# Patient Record
Sex: Male | Born: 1955 | Race: White | Hispanic: No | Smoking: Former smoker
Health system: Southern US, Community
[De-identification: ages and names within clinical notes are randomized; demographics above are authoritative.]

## PROBLEM LIST (undated history)

## (undated) DIAGNOSIS — M1990 Unspecified osteoarthritis, unspecified site: Secondary | ICD-10-CM

## (undated) DIAGNOSIS — F419 Anxiety disorder, unspecified: Secondary | ICD-10-CM

## (undated) DIAGNOSIS — M4802 Spinal stenosis, cervical region: Secondary | ICD-10-CM

## (undated) DIAGNOSIS — F329 Major depressive disorder, single episode, unspecified: Secondary | ICD-10-CM

## (undated) DIAGNOSIS — F209 Schizophrenia, unspecified: Secondary | ICD-10-CM

## (undated) DIAGNOSIS — R413 Other amnesia: Secondary | ICD-10-CM

## (undated) DIAGNOSIS — F32A Depression, unspecified: Secondary | ICD-10-CM

## (undated) DIAGNOSIS — I10 Essential (primary) hypertension: Secondary | ICD-10-CM

## (undated) DIAGNOSIS — K220 Achalasia of cardia: Secondary | ICD-10-CM

## (undated) DIAGNOSIS — K219 Gastro-esophageal reflux disease without esophagitis: Secondary | ICD-10-CM

## (undated) DIAGNOSIS — F0391 Unspecified dementia with behavioral disturbance: Secondary | ICD-10-CM

## (undated) DIAGNOSIS — F03918 Unspecified dementia, unspecified severity, with other behavioral disturbance: Secondary | ICD-10-CM

## (undated) HISTORY — PX: DIAGNOSTIC LAPAROSCOPY: SUR761

## (undated) HISTORY — PX: APPENDECTOMY: SHX54

## (undated) HISTORY — PX: HERNIA REPAIR: SHX51

## (undated) HISTORY — DX: Essential (primary) hypertension: I10

---

## 2005-03-06 ENCOUNTER — Ambulatory Visit: Payer: Self-pay | Admitting: Cardiology

## 2005-12-27 ENCOUNTER — Ambulatory Visit: Payer: Self-pay | Admitting: Internal Medicine

## 2006-01-11 ENCOUNTER — Ambulatory Visit: Payer: Self-pay | Admitting: Internal Medicine

## 2006-01-11 ENCOUNTER — Ambulatory Visit (HOSPITAL_COMMUNITY): Admission: RE | Admit: 2006-01-11 | Discharge: 2006-01-11 | Payer: Self-pay | Admitting: Internal Medicine

## 2006-02-19 ENCOUNTER — Ambulatory Visit: Payer: Self-pay | Admitting: Internal Medicine

## 2008-04-07 ENCOUNTER — Ambulatory Visit: Payer: Self-pay | Admitting: Cardiology

## 2008-04-13 ENCOUNTER — Ambulatory Visit: Payer: Self-pay | Admitting: Cardiology

## 2008-05-08 ENCOUNTER — Ambulatory Visit: Payer: Self-pay | Admitting: Cardiology

## 2009-06-04 ENCOUNTER — Ambulatory Visit: Payer: Self-pay | Admitting: Cardiology

## 2011-03-28 ENCOUNTER — Ambulatory Visit (INDEPENDENT_AMBULATORY_CARE_PROVIDER_SITE_OTHER): Payer: BC Managed Care – PPO | Admitting: Internal Medicine

## 2011-03-28 DIAGNOSIS — R1011 Right upper quadrant pain: Secondary | ICD-10-CM

## 2011-03-28 DIAGNOSIS — R1012 Left upper quadrant pain: Secondary | ICD-10-CM

## 2011-03-28 DIAGNOSIS — R112 Nausea with vomiting, unspecified: Secondary | ICD-10-CM

## 2011-03-31 ENCOUNTER — Encounter (HOSPITAL_BASED_OUTPATIENT_CLINIC_OR_DEPARTMENT_OTHER): Payer: BC Managed Care – PPO | Admitting: Internal Medicine

## 2011-03-31 ENCOUNTER — Ambulatory Visit (HOSPITAL_COMMUNITY)
Admission: RE | Admit: 2011-03-31 | Discharge: 2011-03-31 | Disposition: A | Discharge status: Home / Self Care | Payer: BC Managed Care – PPO | Source: Ambulatory Visit | Attending: Internal Medicine | Admitting: Internal Medicine

## 2011-03-31 DIAGNOSIS — R142 Eructation: Secondary | ICD-10-CM

## 2011-03-31 DIAGNOSIS — R112 Nausea with vomiting, unspecified: Secondary | ICD-10-CM

## 2011-03-31 DIAGNOSIS — B3781 Candidal esophagitis: Secondary | ICD-10-CM

## 2011-03-31 DIAGNOSIS — R109 Unspecified abdominal pain: Secondary | ICD-10-CM | POA: Insufficient documentation

## 2011-03-31 DIAGNOSIS — K220 Achalasia of cardia: Secondary | ICD-10-CM | POA: Insufficient documentation

## 2011-03-31 DIAGNOSIS — E119 Type 2 diabetes mellitus without complications: Secondary | ICD-10-CM | POA: Insufficient documentation

## 2011-03-31 DIAGNOSIS — R141 Gas pain: Secondary | ICD-10-CM

## 2011-03-31 LAB — GLUCOSE, CAPILLARY: Glucose-Capillary: 173 mg/dL — ABNORMAL HIGH (ref 70–99)

## 2011-04-11 NOTE — Assessment & Plan Note (Signed)
[facility]                          [facility]   NAME:CHILTONAndrius, Derek Hunter                       MRN:          [id number]  DATE:04/07/2008                            DOB:          26-Jun-1956    HISTORY:  Derek Hunter has some chest discomfort and shortness of breath.  He is referred by Dr. Fara Chute for further evaluation.  Derek Hunter  can feel this chest discomfort or shortness of breath at any time.  He  has not had any syncope or presyncope.  He is aware that he has an  anxiety problem and that this can play a role in his symptomatology.  He  had a stress echo done in April 2006.  At that time, there were  excellent pictures and he had good wall motion.  He did have some  prolonging of his increased heart rate after stress and he had 3 beats  of supraventricular tachycardia in the past.   ALLERGIES:  NO KNOWN DRUG ALLERGIES.   MEDICATIONS:  1. Protonix.  2. Citalopram.   OTHER MEDICAL PROBLEMS:  See the list below.   SOCIAL HISTORY:  The patient is married and works at [employer].   FAMILY HISTORY:  There is a family history of coronary disease.   REVIEW OF SYSTEMS:  Overall, he is feeling fairly well today.  He does  have some increased gas.  He knows that he feels anxious about things in  general.  Otherwise, he also has some mild dizziness.  Otherwise, his  review of systems is negative.   PHYSICAL EXAMINATION:  VITAL SIGNS:  Blood pressure is 142/80 with a  pulse of 79.  Weight is 195 pounds.  GENERAL:  The patient is oriented  to person, time and place.  Affect reveals that at baseline he appeared  to be mildly anxious.  He is here with his wife today.  HEENT:  Reveals no xanthelasma.  He has normal extraocular motion.  NECK:  There are no carotid bruits.  There is no jugular venous  distention.  LUNGS:  Clear.  Respiratory effort is not labored.  CARDIAC:  Reveals an S1-S2.  There are no clicks or  significant murmurs.  ABDOMEN:  Soft.  He has normal bowel sounds.  EXTREMITIES:  There is no peripheral edema.  There are 2+ distal pulses.   DIAGNOSTICS:  EKG is normal.   PROBLEMS:  1. History of appendectomy and hernia surgery.  2. History of gastroesophageal reflux disease.  3. History of some question of elevated cholesterol which is followed      by Dr. Neita Carp.  4. Also question of increased glucose.  This issue is followed by Dr.      Neita Carp.  5. Anxiety.  This definitely plays a role with his overall status.  6. Chest pain and shortness of breath.  At this point today, I do not      find any definite evidence of coronary disease.  However, he does      have a family history of coronary disease.  I feel that  we should      proceed with a follow up stress echo to reassess his overall      status.  I will then see him in followup to continue to help      reassure him if his study shows no abnormalities.     Derek Abed, MD, [facility]  Electronically Signed    JDK/MedQ  DD: 04/07/2008  DT: 04/07/2008  Job #: [id number]   cc:   Fara Chute

## 2011-04-11 NOTE — Assessment & Plan Note (Signed)
[facility]                          [facility]   NAME:Derek Hunter, Derek Hunter                       MRN:          [id number]  DATE:05/08/2008                            DOB:          08/25/1956    Mr. Dombeck is seen for followup.  I saw him on 04/07/2008.  See my  consult note.  At that time, he had been having some chest pain and  shortness of breath.  There were some risk factors.  We decided to  proceed with a stress echo.  The stress echo was completely normal.  The  patient returned today and we talked further.  He is feeling well.  He  has not had any marked symptoms since the last visit.   PAST MEDICAL HISTORY:   ALLERGIES:  No known drug allergies.   MEDICATIONS:  Protonix and citalopram.  He feels that the citalopram is  helping him.   OTHER MEDICAL PROBLEMS:  See the list on my note of 04/07/2008.   REVIEW OF SYSTEMS:  He is stable today and his review of systems is  negative.   PHYSICAL EXAMINATION:  Blood pressure is 130/76 with a pulse of 75.  The  patient is oriented to person, time, and place.  Cardiac exam reveals an  S1 with an S2, but no clicks or significant murmurs.   Problems include those listed on my note of 04/07/2008.  #5.  Anxiety.  This definitely plays a role.  Citalopram is helping.  #6.  Chest pain and shortness of breath.  His stress echo was normal.  I  would suggest primary prevention measures and no further cardiac workup  at this time.     Luis Abed, MD, [facility]  Electronically Signed    JDK/MedQ  DD: 05/08/2008  DT: 05/09/2008  Job #: [id number]   cc:   Fara Chute

## 2011-04-14 NOTE — Op Note (Signed)
NAME:  Derek Hunter, Derek Hunter                ACCOUNT NO.:  [id number]   MEDICAL RECORD NO.:  [id number]          PATIENT TYPE:  AMB   LOCATION:  DAY                           FACILITY:  [facility]   PHYSICIAN:  R. Roetta Sessions, M.D. DATE OF BIRTH:  07-01-1956   DATE OF PROCEDURE:  01/11/2006  DATE OF DISCHARGE:                                 OPERATIVE REPORT   PROCEDURE:  Esophagogastroduodenoscopy with Elease Hashimoto dilation followed by  diagnostic colonoscopy and ileoscopy.   INDICATIONS FOR PROCEDURE:  The patient is a 55 year old gentleman with  abdominal swelling and atypical chest pain. Reflux symptoms felt to be  well controlled on once daily Protonix 40 mg. He has never had his lower GI  tract imaged. Colonoscopy and EGD are now being done. This approach has been  discussed with the patient at length. Potential risks, benefits, and  alternatives have been reviewed. The potential for esophageal dilation has  also been reviewed with the potential risk, benefits and alternatives. All  questions were answered. Please see the documentation in the medical record.   PROCEDURE NOTE:  O2 saturation, blood pressure, pulse, and respirations were  monitored throughout the entire procedure. Conscious sedation with Versed 5  mg IV and Demerol 100 mg IV in divided doses.   INSTRUMENT:  Olympus video chip system.   FINDINGS:  Esophagogastroduodenoscopy:  Examination of the tubular esophagus  revealed questionable cervical web. Esophageal mucosa otherwise appeared  normal. EG junction easily traversed.   Stomach:  Gastric cavity was empty and insufflated well with air. Thorough  examination of gastric mucosa including retroflexed view of the proximal  stomach and esophagogastric junction demonstrated no abnormalities aside  from hiatal hernia. Pylorus patent and easily traversed. Examination of bulb  and second portion revealed no abnormalities.   THERAPEUTIC/DIAGNOSTIC MANEUVERS:  A 56-French Maloney  dilator was passed to  full insertion with ease. A look back revealed no apparent complication  related to passage of the dilator. There is a small rent on the mucosa at  the EG junction.   The patient tolerated the procedure well and was prepared for colonoscopy.  Digital rectal examination revealed no abnormalities.   ENDOSCOPIC FINDINGS:  Prep was good.   Rectum:  Examination of the rectal mucosa including retroflexed view of the  anal verge revealed anal verge revealed no abnormalities.   Colon:  Colonic mucosa was surveyed from the rectosigmoid junction through  the left, transverse, and right colon to the area of the appendiceal  orifice, ileocecal valve, and cecum. These structures were well seen and  photographed for the record. The terminal ileum was intubated to 10 cm. From  this level, the scope was slowly and cautiously withdrawn, and all  previously mentioned mucosal surfaces were again seen. The colonic mucosa  appeared normal. The patient tolerated the procedure well and was reactive  to endoscopy. Cecal withdraw time 10 minutes.   IMPRESSION:  Colonoscopy:  Normal rectum, colon, terminal ileum.   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  Otherwise normal appearing esophageal mucosa status post dilation as  described above. Small hiatal hernia.  Otherwise normal stomach, D1 and D2.   RECOMMENDATIONS:  1.  Increase her Protonix to 40 mg orally twice daily for the next six      weeks.  2.  Constipation literature given to Mr. Ebbert.  3.  Daily fiber supplementation in the way of Benefiber or Fibersure      emphasized.  4.  Appointment to see me back in the office in six weeks to see how he is      doing. He will not need a colonoscopy for screening purposes until 2017.      Jonathon Bellows, M.D.  Electronically Signed     RMR/MEDQ  D:  01/11/2006  T:  01/11/2006  Job:  [id number]   cc:   Fara Chute  Fax: [phone]

## 2011-04-14 NOTE — Consult Note (Signed)
NAME:  Derek Hunter, Derek Hunter                ACCOUNT NO.:  [id number]   MEDICAL RECORD NO.:  [id number]           PATIENT TYPE:   LOCATION:                                FACILITY:  [facility]   PHYSICIAN:  R. Roetta Sessions, M.D. DATE OF BIRTH:  1956-08-14   DATE OF CONSULTATION:  12/27/2005  DATE OF DISCHARGE:                                   CONSULTATION   GASTROENTEROLOGY CONSULTATION:   DATE OF CONSULTATION:  December 27, 2005   PHYSICIAN REQUESTING CONSULTATION:  Dr. Fara Chute   REASON FOR CONSULTATION:  Abdominal swelling, atypical chest pain.   HISTORY OF PRESENT ILLNESS:  Derek Hunter is a 55 year old Caucasian  gentleman who presents with approximately 71-month history of abdominal  swelling and associated atypical chest pain.  He has anxiety and panic  attacks.  Any time he has chest discomfort he develops panic attacks,  tachycardia, and shortness of breath.  When he initially presented in April  2006, he was admitted to the hospital for observation.  He had a stress  echocardiogram which revealed no significant ectopy.  He had a prolonged  recovery period for his heart rate to slow down.  There is no evidence of  scar or ischemia on echo images.  Study was felt to be unremarkable.  He  also ruled out for MI with serial cardiac enzymes.  It was felt he may be  having acid reflux.  He was put on Protonix at that time.  He states he did  fairly well for a few months but over the last month to two he has been  having increased problems.  He has never had heartburn.  He does have  indigestion and belching.  Most of the time this provides relief of his  atypical chest pain.  He complains of abdominal bloating and swelling.  He  may wake up in the morning with a flat abdomen and within half an hour he  feels bloated.  This usually is helped with eating.  He has constipation.  Generally moves his bowels every other day.  Denies any melena or rectal  bleeding.  He occasionally has dysphagia.   This is primarily to solid food.  He used to have nocturnal reflux but now he has his bed elevated.  He is  also on Protonix which seems to help.  He has chronic indigestion for  several years' duration.  He used to take Rolaids and Mylanta only.  He says  he has a severe paranoia to medications.  He is afraid they are going to  cause him ill effect.   CURRENT MEDICATIONS:  1.  Protonix 40 mg daily.  2.  Rolaids p.r.n.  3.  Mylanta p.r.n.   ALLERGIES:  No known drug allergies.   PAST MEDICAL HISTORY:  Diabetes mellitus trying to control with diet;  hypercholesterolemia trying to control with diet; anxiety with panic  attacks; paranoia; depression; atypical chest pain; status post hernia  repair twice; appendectomy.   FAMILY HISTORY:  Mother has diabetes mellitus and has had a coronary artery  bypass graft at age  67.  Father died of MI at age 59.  He had a history of  prostate or lung cancer - patient is not sure.  No family history of  colorectal cancer.  He had a brother who died of throat cancer.   SOCIAL HISTORY:  He is currently separated.  He has one daughter named Karoline Caldwell  who is present today.  He works at [employer] in [place].  No tobacco or  alcohol  use.   REVIEW OF SYSTEMS:  GI:  See HPI.  Denies any weight loss.  CARDIOPULMONARY:  See HPI.   PHYSICAL EXAMINATION:  VITAL SIGNS:  Weight 197, height 6 feet 1 inch,  temperature 98, blood pressure 142/70, pulse 78.  GENERAL:  Pleasant, well-nourished, well-developed Caucasian male in no  acute distress.  He is accompanied by his daughter Karoline Caldwell.  SKIN:  Warm and dry; no jaundice.  HEENT:  Conjunctivae are pink.  Sclerae nonicteric.  Oropharyngeal mucosa  moist and pink.  No lesions, erythema, or exudate.  No lymphadenopathy or  thyromegaly.  CHEST:  Lungs are clear to auscultation.  Cardiac exam reveals regular rate  and rhythm.  Normal S1/S2.  No murmurs, rubs, or gallops.  ABDOMEN:  Positive bowel sounds, soft,  nontender, nondistended.  No  organomegaly or masses.  No rebound tenderness or guarding.  No abdominal  bruits or hernias.  EXTREMITIES:  No edema.   LABORATORIES:  Labs in July 2006 revealed a hemoglobin A1c of 6.9.   IMPRESSION:  Derek Hunter is a 55 year old Caucasian gentleman with 71-month  history of abdominal swelling associated with atypical chest pain.  He also  has chronic gastroesophageal reflux disease/atypical symptoms of  indigestion.  Symptoms have been progressive over the last 1-2 months.  He  has been on Protonix daily for about 9 months.  He has intermittent solid  food dysphagia which is quite rare.  He complains of chronic constipation as  well.  His atypical chest pain may be due to gastroesophageal reflux  disease.  Abdominal swelling and bloating is somewhat of a vague symptom.  Given his constipation, he may have irritable bowel syndrome.  He has never  had an upper or lower endoscopy.  Recommend one at this time primarily for  diagnostic purposes.   PLAN:  1.  Colonoscopy and EGD with possible esophageal dilatation in the near      future.  2.  He will continue Protonix 40 mg daily for now.  3.  Further recommendations to follow.      Tana Coast, P.AJonathon Bellows, M.D.  Electronically Signed   LL/MEDQ  D:  12/27/2005  T:  12/27/2005  Job:  [id number]   cc:   Fara Chute  Fax: [phone]

## 2011-04-16 NOTE — Op Note (Signed)
NAME:  Derek Hunter, Derek Hunter                ACCOUNT NO.:  [id number]  MEDICAL RECORD NO.:  [id number]           PATIENT TYPE:  O  LOCATION:  DAYP                          FACILITY:  [facility]  PHYSICIAN:  Lionel December, M.D.    DATE OF BIRTH:  [birth year]  DATE OF PROCEDURE:  03/31/2011 DATE OF DISCHARGE:                              OPERATIVE REPORT   PROCEDURE:  Esophagogastroduodenoscopy with esophageal dilation.  INDICATION:  Derek Hunter is 55 year old Caucasian male who presents with intractable burping, bloating, postprandial nausea, vomiting, and upper abdominal pain.  He has been on Nexium chronically for GERD.  Dose was doubled up by Dr. Fara Chute, but he did not feel any better.  He is undergoing diagnostic EGD.  Procedure risks were reviewed with the patient.  Informed consent was obtained.  MEDS FOR CONSCIOUS SEDATION:  Cetacaine spray for oropharyngeal topical anesthesia, Demerol 50 mg IV, Versed 4 mg IV.  FINDINGS:  Procedure performed in endoscopy suite.  The patient's vital signs and O2 sat were monitored during the procedure and remained stable.  The patient was placed in left lateral recumbent position and Pentax videoscope was passed through oropharynx without any difficulty into esophagus.  Esophagus.  Esophageal body was dilated with food debris.  Some of it was suctioned out.  Underlying mucosa revealed patchy cheesy exudate consistent with Candida esophagitis.  Distally, esophageal lumen narrowed, resistance noted at GE junction as the scope was passed into the stomach.  GE junction was serrated.  No mass ulcer was noted in this area.  Stomach.  It was empty and distended very well by insufflation.  Folds of proximal stomach are normal.  Examination of mucosa at body, antrum, pyloric channel, as well as fundus and cardia was normal.  Duodenum.  Bulbar mucosa was normal.  Scope was passed into second part of the duodenal mucosa and folds were normal.  Gastroesophageal  junction or LES was dilated with the balloon.  Balloon dilator was passed in the scope.  Guidewire was placed in the gastric lumen.  Balloon was positioned across GE junction and gradually insufflated to a diameter of 18 mm.  I was able to pass the balloon distally.  Attempt to dilate this segment to 19 mm, but never could get the balloon optimally placed because of food debris.  The balloon dilator was withdrawn.  Some of the food debris was pushed down and some was suctioned out via scope, but esophagus was not completely clear of all the food debris.  Endoscope was withdrawn.  The patient tolerated the procedure well.  FINAL DIAGNOSES: 1. Dilated esophageal body with food debris and distal narrowing at GE     junction or lower esophageal sphincter.  These findings felt to be     typical of achalasia.  Candida esophagitis felt to be due to stasis     and not the primary process. 2. Gastroesophageal junction/lower esophageal sphincter was dilated     with a balloon to 18 mm which hopefully would provide temporal     relief.  RECOMMENDATIONS: 1. The patient advised to stay on clear liquids today. 2. Mycostatin suspension  5000 units swish and swallow q.i.d. for 2     weeks.  Prescription given along with two refills.  The patient is scheduled for upper abdominal ultrasound on Apr 03, 2011, at [facility].  We will also ask the barium study be done at the same time. Once this is completed, he will need esophageal manometry for confirmation of diagnosis of achalasia before he is referred for Heller's myotomy and partial wrap.          ______________________________ Lionel December, M.D.     NR/MEDQ  D:  03/31/2011  T:  03/31/2011  Job:  [id number]  cc:   Fara Chute Fax: [phone]  Electronically Signed by Lionel December M.D. on 04/16/2011 07:28:53 PM

## 2011-04-16 NOTE — Consult Note (Addendum)
NAME:  Derek Hunter, Derek Hunter                ACCOUNT NO.:  [id number]  MEDICAL RECORD NO.:  [id number]           PATIENT TYPE:  LOCATION:                                 FACILITY:  PHYSICIAN:  Lionel December, M.D.    DATE OF BIRTH:  08-Oct-1956  DATE OF CONSULTATION: DATE OF DISCHARGE:                                CONSULTATION   PRESENTING COMPLAINT:  Intractable burping, bloating, postprandial nausea, vomiting, and upper abdominal pain.  HISTORY OF PRESENT ILLNESS:  Derek Hunter is a 55 year old Caucasian male who is referred through courtesy of Dr. Fara Chute, for GI evaluation.  He has history of GERD and has been maintained on a PPI for number of years. He was last seen by Dr. Jena Gauss, in February 2007.  At that time, he presented with atypical chest pain, abdominal swelling.  He underwent diagnostic EGD and a screening colonoscopy.  He had cervical esophageal web which was disrupted with a Maloney dilator.  He has small sliding hiatal hernia, otherwise normal exam.  His colonoscopy was also normal.  The patient states that he has been doing well until about 2-3 months ago when he noted postprandial fullness, nausea, intermittent vomiting, regurgitation, and a bitter taste in his mouth.  Furthermore, he has been with burping frequently after each meal.  He feels his food just is in the stomach and does not go any where.  He also has experienced sharp pain in epigastric region and right upper quadrant after meals.  He states his appetite is decreased.  Food taste does not feel right.  He states he has lost about 9-10 pounds in the last 3 weeks.  He is still having daily heartburn despite taking Nexium twice a day and OTC ranitidine.  He states he lives off cereals.  He generally eats two meals a day.  He rarely cooks food.  He either eats cereals or eats out at [employer].  He states his diabetic control is improving, however, Dr. Neita Carp, told him that his hemoglobin A1c was still not in  a desirable range.  In December 2011, it was 8.0.  He denies hematemesis, melena, rectal bleeding.  He states his bowels move regularly.  He says Nexium twice a day has helped with his chest pain, but not with heartburn or other symptoms.  He states he has no difficulty swallowing liquids or solids.  When he came to our office, I was given his impression that he is also having dysphagia at least to me he denies this symptom.  He denies fever, chills, or night sweats.  CURRENT MEDICATIONS: 1. Metformin 1 g p.o. b.i.d. 2. Actos plus metformin 15/500 daily at bedtime. 3. Nexium 40 mg p.o. b.i.d. 4. Citalopram 40 mg daily. 5. Ranitidine 150 mg p.o. bedtime. 6. Gas-X p.r.n. 7. Asa 81 mg daily.  PAST MEDICAL HISTORY:  History of GERD prior to EGD as above.  He also had a screening colonoscopy in February 2007, which was unremarkable. He had appendectomy over 20 years ago.  He has had bilateral inguinal herniorrhaphy within the last 15-20 years.  He had circumcision 18 years ago for  phimosis.  He has been diabetic for 3-4 years.  He has history of stress disorder resulting from taking care of his mother for 3 years before she passed away.  ALLERGIES:  To PENICILLIN, but type of reaction not known.  FAMILY HISTORY:  Mother was diabetic and had heart problems and died at age 22.  Father died of lung carcinoma at age 55.  He has two sisters in good health.  He has four brothers in good health.  While he lost one brother of laryngeal cancer at age 42 two years after initial surgery. The patient states that he only talks one of his brothers and has no contact with other siblings relating to mother's illness and care, etc.  SOCIAL HISTORY:  He is separated.  He has a daughter in good health.  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  He smoked 2-3 packs of cigarettes per day for 10-12 years, but quit over 20 years ago and he does not drink  alcohol.  OBJECTIVE:  VITAL SIGNS:  Weight 196 pounds, he is 73 inches tall, pulse is 68 per minute, blood pressure 118/74, temp is 98.4. HEENT:  Conjunctivae pink.  Sclerae nonicteric.  Oropharyngeal mucosa is normal.  Dentition in satisfactory condition.  No neck masses or thyromegaly noted. CARDIAC:  With regular rhythm.  Normal S1 and S2.  No murmur or gallop noted. LUNGS:  Clear to auscultation. ABDOMEN:  Symmetrical.  Bowel sounds are normal.  On palpation soft abdomen with mild tenderness in epigastrium and below the right costal margin.  No hepatosplenomegaly or mass is noted. RECTAL:  Reveals scant amount of stool which is guaiac negative. EXTREMITIES:  No peripheral edema or clubbing noted.  LABORATORY DATA:  From Dr. Dian Situ office from November 21, 2010, his hemoglobin A1c was 8.0.  ASSESSMENT:  Derek Hunter is a 55 year old Caucasian male with chronic gastroesophageal reflux disease and maintained on proton pump inhibitor. His dose was doubled over 2 months ago without symptomatic relief.  He has experiencing postprandial epigastric and right upper quadrant pain, frequent regurgitation, burping, as well as nausea and vomiting.  He has lost 9-10 pounds in the last 3 weeks since his intake has decreased.  He denies hematemesis, melena, or rectal bleeding.  His last EGD was in February 2007, and did not reveal evidence of peptic ulcer disease.  He had esophageal valve which was disrupted by passing a Maloney dilator. Similarly, his colonoscopy was unremarkable.  The patient's current symptoms suggest gastroparesis, peptic ulcer disease/pyloric stenosis or he could also have cholelithiasis.  I am afraid his upper GI tract needs to be evaluated along with looking at the biliary tree.  RECOMMENDATIONS: 1. He will continue antireflux measures and Nexium and ranitidine at     present dose. 2. He will have CBC and Chem-20. 3. Metoclopramide 10 mg 3 times a day prescription given  for 60     without refill.  The patient informed of potential side effects and     should he experience any, he can stop the medication.  We will schedule him for upper abdominal ultrasound and diagnostic esophagogastroduodenoscopy.  I have reviewed the procedure risks with the patient and he is agreeable.  We appreciate the opportunity to participate in the care of this gentleman.          ______________________________ Lionel December, M.D.     NR/MEDQ  D:  03/28/2011  T:  03/29/2011  Job:  [id number]  Electronically Signed by Lionel December M.D. on 05/08/2011 10:12:35 AM

## 2011-04-26 ENCOUNTER — Emergency Department (HOSPITAL_COMMUNITY)
Admission: EM | Admit: 2011-04-26 | Discharge: 2011-04-26 | Disposition: A | Discharge status: Home / Self Care | Payer: BC Managed Care – PPO | Attending: Emergency Medicine | Admitting: Emergency Medicine

## 2011-04-26 ENCOUNTER — Emergency Department (HOSPITAL_COMMUNITY): Payer: BC Managed Care – PPO

## 2011-04-26 ENCOUNTER — Encounter (HOSPITAL_COMMUNITY): Payer: Self-pay

## 2011-04-26 DIAGNOSIS — R0602 Shortness of breath: Secondary | ICD-10-CM | POA: Insufficient documentation

## 2011-04-26 DIAGNOSIS — F411 Generalized anxiety disorder: Secondary | ICD-10-CM | POA: Insufficient documentation

## 2011-04-26 DIAGNOSIS — E119 Type 2 diabetes mellitus without complications: Secondary | ICD-10-CM | POA: Insufficient documentation

## 2011-04-26 DIAGNOSIS — R079 Chest pain, unspecified: Secondary | ICD-10-CM | POA: Insufficient documentation

## 2011-04-26 DIAGNOSIS — Z79899 Other long term (current) drug therapy: Secondary | ICD-10-CM | POA: Insufficient documentation

## 2011-04-26 DIAGNOSIS — R1013 Epigastric pain: Secondary | ICD-10-CM | POA: Insufficient documentation

## 2011-04-26 HISTORY — DX: Achalasia of cardia: K22.0

## 2011-04-26 LAB — DIFFERENTIAL
Basophils Relative: 1 % (ref 0–1)
Eosinophils Absolute: 0.3 10*3/uL (ref 0.0–0.7)
Eosinophils Relative: 4 % (ref 0–5)
Monocytes Absolute: 0.5 10*3/uL (ref 0.1–1.0)
Monocytes Relative: 7 % (ref 3–12)

## 2011-04-26 LAB — COMPREHENSIVE METABOLIC PANEL
ALT: 22 U/L (ref 0–53)
Albumin: 3.9 g/dL (ref 3.5–5.2)
Alkaline Phosphatase: 57 U/L (ref 39–117)
Chloride: 96 mEq/L (ref 96–112)
Glucose, Bld: 207 mg/dL — ABNORMAL HIGH (ref 70–99)
Potassium: 3.8 mEq/L (ref 3.5–5.1)
Sodium: 136 mEq/L (ref 135–145)
Total Protein: 7.4 g/dL (ref 6.0–8.3)

## 2011-04-26 LAB — CBC
Hemoglobin: 15.1 g/dL (ref 13.0–17.0)
MCH: 31.5 pg (ref 26.0–34.0)
MCHC: 35.7 g/dL (ref 30.0–36.0)
Platelets: 220 10*3/uL (ref 150–400)
RDW: 12.2 % (ref 11.5–15.5)

## 2011-04-26 LAB — PROTIME-INR: INR: 1.01 (ref 0.00–1.49)

## 2011-04-26 LAB — CK TOTAL AND CKMB (NOT AT ARMC): CK, MB: 1.5 ng/mL (ref 0.3–4.0)

## 2011-04-26 LAB — POCT I-STAT, CHEM 8
Creatinine, Ser: 1 mg/dL (ref 0.4–1.5)
HCT: 45 % (ref 39.0–52.0)
Hemoglobin: 15.3 g/dL (ref 13.0–17.0)
Sodium: 136 mEq/L (ref 135–145)
TCO2: 29 mmol/L (ref 0–100)

## 2011-04-26 MED ORDER — IOHEXOL 300 MG/ML  SOLN
25.0000 mL | Freq: Once | INTRAMUSCULAR | Status: AC | PRN
  Administered 2011-04-26: 80 mL via INTRAVENOUS

## 2014-05-08 ENCOUNTER — Other Ambulatory Visit: Payer: Self-pay | Admitting: Neurosurgery

## 2014-05-08 DIAGNOSIS — M542 Cervicalgia: Secondary | ICD-10-CM

## 2014-05-18 ENCOUNTER — Inpatient Hospital Stay
Admission: RE | Admit: 2014-05-18 | Discharge: 2014-05-18 | Disposition: A | Discharge status: Home / Self Care | Payer: BC Managed Care – PPO | Source: Ambulatory Visit | Attending: Neurosurgery | Admitting: Neurosurgery

## 2014-05-18 ENCOUNTER — Other Ambulatory Visit: Payer: BC Managed Care – PPO

## 2014-05-18 NOTE — Discharge Instructions (Signed)
Myelogram Discharge Instructions  1. Go home and rest quietly for the next 24 hours.  It is important to lie flat for the next 24 hours.  Get up only to go to the restroom.  You may lie in the bed or on a couch on your back, your stomach, your left side or your right side.  You may have one pillow under your head.  You may have pillows between your knees while you are on your side or under your knees while you are on your back.  2. DO NOT drive today.  Recline the seat as far back as it will go, while still wearing your seat belt, on the way home.  3. You may get up to go to the bathroom as needed.  You may sit up for 10 minutes to eat.  You may resume your normal diet and medications unless otherwise indicated.  Drink lots of extra fluids today and tomorrow.  4. The incidence of headache, nausea, or vomiting is about 5% (one in 20 patients).  If you develop a headache, lie flat and drink plenty of fluids until the headache goes away.  Caffeinated beverages may be helpful.  If you develop severe nausea and vomiting or a headache that does not go away with flat bed rest, call [phone].  5. You may resume normal activities after your 24 hours of bed rest is over; however, do not exert yourself strongly or do any heavy lifting tomorrow. If when you get up you have a headache when standing, go back to bed and force fluids for another 24 hours.  6. Call your physician for a follow-up appointment.  The results of your myelogram will be sent directly to your physician by the following day.  7. If you have any questions or if complications develop after you arrive home, please call [phone].  Discharge instructions have been explained to the patient.  The patient, or the person responsible for the patient, fully understands these instructions.      May resume Celexa on May 19, 2014, after 1:00 pm.

## 2015-05-06 ENCOUNTER — Emergency Department (HOSPITAL_COMMUNITY)
Admission: EM | Admit: 2015-05-06 | Discharge: 2015-05-06 | Disposition: A | Discharge status: Home / Self Care | Payer: Medicaid Other | Attending: Emergency Medicine | Admitting: Emergency Medicine

## 2015-05-06 ENCOUNTER — Encounter (HOSPITAL_COMMUNITY): Payer: Self-pay | Admitting: *Deleted

## 2015-05-06 DIAGNOSIS — Z8719 Personal history of other diseases of the digestive system: Secondary | ICD-10-CM | POA: Diagnosis not present

## 2015-05-06 DIAGNOSIS — Z87891 Personal history of nicotine dependence: Secondary | ICD-10-CM | POA: Insufficient documentation

## 2015-05-06 DIAGNOSIS — Z87828 Personal history of other (healed) physical injury and trauma: Secondary | ICD-10-CM | POA: Diagnosis not present

## 2015-05-06 DIAGNOSIS — R110 Nausea: Secondary | ICD-10-CM | POA: Diagnosis not present

## 2015-05-06 DIAGNOSIS — Z8659 Personal history of other mental and behavioral disorders: Secondary | ICD-10-CM | POA: Insufficient documentation

## 2015-05-06 DIAGNOSIS — R51 Headache: Secondary | ICD-10-CM | POA: Insufficient documentation

## 2015-05-06 DIAGNOSIS — E119 Type 2 diabetes mellitus without complications: Secondary | ICD-10-CM | POA: Diagnosis not present

## 2015-05-06 DIAGNOSIS — R519 Headache, unspecified: Secondary | ICD-10-CM

## 2015-05-06 DIAGNOSIS — Z880 Allergy status to penicillin: Secondary | ICD-10-CM | POA: Diagnosis not present

## 2015-05-06 HISTORY — DX: Major depressive disorder, single episode, unspecified: F32.9

## 2015-05-06 HISTORY — DX: Anxiety disorder, unspecified: F41.9

## 2015-05-06 HISTORY — DX: Depression, unspecified: F32.A

## 2015-05-06 HISTORY — DX: Other amnesia: R41.3

## 2015-05-06 MED ORDER — SODIUM CHLORIDE 0.9 % IV BOLUS (SEPSIS)
1000.0000 mL | Freq: Once | INTRAVENOUS | Status: AC
  Administered 2015-05-06: 1000 mL via INTRAVENOUS

## 2015-05-06 MED ORDER — PROCHLORPERAZINE EDISYLATE 5 MG/ML IJ SOLN
10.0000 mg | Freq: Four times a day (QID) | INTRAMUSCULAR | Status: DC | PRN
  Administered 2015-05-06: 10 mg via INTRAVENOUS
  Filled 2015-05-06: qty 2

## 2015-05-06 MED ORDER — BUTALBITAL-APAP-CAFFEINE 50-325-40 MG PO TABS: 1.0000 | ORAL_TABLET | Freq: Once | ORAL | Status: DC

## 2015-05-06 MED ORDER — BUTALBITAL-APAP-CAFFEINE 50-325-40 MG PO TABS: 1.0000 | ORAL_TABLET | Freq: Four times a day (QID) | ORAL | Status: AC | PRN

## 2015-05-06 MED ORDER — DIPHENHYDRAMINE HCL 50 MG/ML IJ SOLN
12.5000 mg | Freq: Once | INTRAMUSCULAR | Status: AC
  Administered 2015-05-06: 12.5 mg via INTRAVENOUS
  Filled 2015-05-06: qty 1

## 2015-05-06 NOTE — ED Notes (Signed)
Pt verbalizes understanding of d/c instructions and denies any further needs at this time. 
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
## 2015-05-06 NOTE — Discharge Instructions (Signed)

## 2015-05-06 NOTE — ED Notes (Signed)
TTS at bedside. 

## 2015-05-06 NOTE — ED Notes (Signed)
TTS finished 
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
## 2015-06-24 ENCOUNTER — Ambulatory Visit: Payer: Medicaid Other | Admitting: Neurology

## 2015-11-30 DIAGNOSIS — F419 Anxiety disorder, unspecified: Secondary | ICD-10-CM | POA: Diagnosis not present

## 2015-11-30 DIAGNOSIS — M791 Myalgia: Secondary | ICD-10-CM | POA: Diagnosis not present

## 2015-11-30 DIAGNOSIS — M542 Cervicalgia: Secondary | ICD-10-CM | POA: Diagnosis not present

## 2015-11-30 DIAGNOSIS — K219 Gastro-esophageal reflux disease without esophagitis: Secondary | ICD-10-CM | POA: Diagnosis not present

## 2015-11-30 DIAGNOSIS — R51 Headache: Secondary | ICD-10-CM | POA: Diagnosis not present

## 2015-11-30 DIAGNOSIS — E119 Type 2 diabetes mellitus without complications: Secondary | ICD-10-CM | POA: Diagnosis not present

## 2015-11-30 DIAGNOSIS — Z7984 Long term (current) use of oral hypoglycemic drugs: Secondary | ICD-10-CM | POA: Diagnosis not present

## 2015-11-30 DIAGNOSIS — F0390 Unspecified dementia without behavioral disturbance: Secondary | ICD-10-CM | POA: Diagnosis not present

## 2015-11-30 DIAGNOSIS — M47812 Spondylosis without myelopathy or radiculopathy, cervical region: Secondary | ICD-10-CM | POA: Diagnosis not present

## 2015-11-30 DIAGNOSIS — F329 Major depressive disorder, single episode, unspecified: Secondary | ICD-10-CM | POA: Diagnosis not present

## 2015-11-30 DIAGNOSIS — Z79899 Other long term (current) drug therapy: Secondary | ICD-10-CM | POA: Diagnosis not present

## 2015-11-30 DIAGNOSIS — Z880 Allergy status to penicillin: Secondary | ICD-10-CM | POA: Diagnosis not present

## 2015-12-15 DIAGNOSIS — R1013 Epigastric pain: Secondary | ICD-10-CM | POA: Diagnosis not present

## 2015-12-15 DIAGNOSIS — K219 Gastro-esophageal reflux disease without esophagitis: Secondary | ICD-10-CM | POA: Diagnosis not present

## 2016-02-12 DIAGNOSIS — R1012 Left upper quadrant pain: Secondary | ICD-10-CM | POA: Diagnosis not present

## 2016-02-12 DIAGNOSIS — R1032 Left lower quadrant pain: Secondary | ICD-10-CM | POA: Diagnosis not present

## 2016-02-12 DIAGNOSIS — J101 Influenza due to other identified influenza virus with other respiratory manifestations: Secondary | ICD-10-CM | POA: Diagnosis not present

## 2016-02-13 DIAGNOSIS — J111 Influenza due to unidentified influenza virus with other respiratory manifestations: Secondary | ICD-10-CM | POA: Diagnosis not present

## 2016-02-13 DIAGNOSIS — M47896 Other spondylosis, lumbar region: Secondary | ICD-10-CM | POA: Diagnosis not present

## 2016-02-13 DIAGNOSIS — M545 Low back pain: Secondary | ICD-10-CM | POA: Diagnosis not present

## 2016-02-13 DIAGNOSIS — Z7984 Long term (current) use of oral hypoglycemic drugs: Secondary | ICD-10-CM | POA: Diagnosis not present

## 2016-02-13 DIAGNOSIS — Z79899 Other long term (current) drug therapy: Secondary | ICD-10-CM | POA: Diagnosis not present

## 2016-02-13 DIAGNOSIS — R1030 Lower abdominal pain, unspecified: Secondary | ICD-10-CM | POA: Diagnosis not present

## 2016-02-13 DIAGNOSIS — R0989 Other specified symptoms and signs involving the circulatory and respiratory systems: Secondary | ICD-10-CM | POA: Diagnosis not present

## 2016-02-13 DIAGNOSIS — N419 Inflammatory disease of prostate, unspecified: Secondary | ICD-10-CM | POA: Diagnosis not present

## 2016-02-13 DIAGNOSIS — K449 Diaphragmatic hernia without obstruction or gangrene: Secondary | ICD-10-CM | POA: Diagnosis not present

## 2016-02-13 DIAGNOSIS — I700 Atherosclerosis of aorta: Secondary | ICD-10-CM | POA: Diagnosis not present

## 2016-02-13 DIAGNOSIS — E119 Type 2 diabetes mellitus without complications: Secondary | ICD-10-CM | POA: Diagnosis not present

## 2016-03-09 DIAGNOSIS — F329 Major depressive disorder, single episode, unspecified: Secondary | ICD-10-CM | POA: Diagnosis not present

## 2016-03-09 DIAGNOSIS — Z880 Allergy status to penicillin: Secondary | ICD-10-CM | POA: Diagnosis not present

## 2016-03-09 DIAGNOSIS — Z7984 Long term (current) use of oral hypoglycemic drugs: Secondary | ICD-10-CM | POA: Diagnosis not present

## 2016-03-09 DIAGNOSIS — K219 Gastro-esophageal reflux disease without esophagitis: Secondary | ICD-10-CM | POA: Diagnosis not present

## 2016-03-09 DIAGNOSIS — M542 Cervicalgia: Secondary | ICD-10-CM | POA: Diagnosis not present

## 2016-03-09 DIAGNOSIS — M47812 Spondylosis without myelopathy or radiculopathy, cervical region: Secondary | ICD-10-CM | POA: Diagnosis not present

## 2016-03-09 DIAGNOSIS — F0390 Unspecified dementia without behavioral disturbance: Secondary | ICD-10-CM | POA: Diagnosis not present

## 2016-03-09 DIAGNOSIS — R000 Tachycardia, unspecified: Secondary | ICD-10-CM | POA: Diagnosis not present

## 2016-03-09 DIAGNOSIS — F419 Anxiety disorder, unspecified: Secondary | ICD-10-CM | POA: Diagnosis not present

## 2016-03-09 DIAGNOSIS — E119 Type 2 diabetes mellitus without complications: Secondary | ICD-10-CM | POA: Diagnosis not present

## 2016-03-09 DIAGNOSIS — Z79899 Other long term (current) drug therapy: Secondary | ICD-10-CM | POA: Diagnosis not present

## 2016-03-13 DIAGNOSIS — F324 Major depressive disorder, single episode, in partial remission: Secondary | ICD-10-CM | POA: Diagnosis not present

## 2016-03-13 DIAGNOSIS — M542 Cervicalgia: Secondary | ICD-10-CM | POA: Diagnosis not present

## 2016-03-13 DIAGNOSIS — G3184 Mild cognitive impairment, so stated: Secondary | ICD-10-CM | POA: Diagnosis not present

## 2016-03-13 DIAGNOSIS — R079 Chest pain, unspecified: Secondary | ICD-10-CM | POA: Diagnosis not present

## 2016-03-13 DIAGNOSIS — E1165 Type 2 diabetes mellitus with hyperglycemia: Secondary | ICD-10-CM | POA: Diagnosis not present

## 2016-03-13 DIAGNOSIS — K210 Gastro-esophageal reflux disease with esophagitis: Secondary | ICD-10-CM | POA: Diagnosis not present

## 2016-03-13 DIAGNOSIS — F064 Anxiety disorder due to known physiological condition: Secondary | ICD-10-CM | POA: Diagnosis not present

## 2016-06-16 DIAGNOSIS — H40033 Anatomical narrow angle, bilateral: Secondary | ICD-10-CM | POA: Diagnosis not present

## 2016-06-16 DIAGNOSIS — E119 Type 2 diabetes mellitus without complications: Secondary | ICD-10-CM | POA: Diagnosis not present

## 2016-07-13 DIAGNOSIS — E1165 Type 2 diabetes mellitus with hyperglycemia: Secondary | ICD-10-CM | POA: Diagnosis not present

## 2016-07-13 DIAGNOSIS — F064 Anxiety disorder due to known physiological condition: Secondary | ICD-10-CM | POA: Diagnosis not present

## 2016-07-13 DIAGNOSIS — K210 Gastro-esophageal reflux disease with esophagitis: Secondary | ICD-10-CM | POA: Diagnosis not present

## 2016-07-17 DIAGNOSIS — E1165 Type 2 diabetes mellitus with hyperglycemia: Secondary | ICD-10-CM | POA: Diagnosis not present

## 2016-07-17 DIAGNOSIS — F064 Anxiety disorder due to known physiological condition: Secondary | ICD-10-CM | POA: Diagnosis not present

## 2016-07-17 DIAGNOSIS — K210 Gastro-esophageal reflux disease with esophagitis: Secondary | ICD-10-CM | POA: Diagnosis not present

## 2016-07-17 DIAGNOSIS — R079 Chest pain, unspecified: Secondary | ICD-10-CM | POA: Diagnosis not present

## 2016-07-17 DIAGNOSIS — F324 Major depressive disorder, single episode, in partial remission: Secondary | ICD-10-CM | POA: Diagnosis not present

## 2016-07-17 DIAGNOSIS — G3184 Mild cognitive impairment, so stated: Secondary | ICD-10-CM | POA: Diagnosis not present

## 2016-07-17 DIAGNOSIS — M542 Cervicalgia: Secondary | ICD-10-CM | POA: Diagnosis not present

## 2016-07-17 DIAGNOSIS — Z6826 Body mass index (BMI) 26.0-26.9, adult: Secondary | ICD-10-CM | POA: Diagnosis not present

## 2016-08-30 DIAGNOSIS — Z6826 Body mass index (BMI) 26.0-26.9, adult: Secondary | ICD-10-CM | POA: Diagnosis not present

## 2016-08-30 DIAGNOSIS — M545 Low back pain: Secondary | ICD-10-CM | POA: Diagnosis not present

## 2016-08-30 DIAGNOSIS — M542 Cervicalgia: Secondary | ICD-10-CM | POA: Diagnosis not present

## 2016-09-26 DIAGNOSIS — M5137 Other intervertebral disc degeneration, lumbosacral region: Secondary | ICD-10-CM | POA: Diagnosis not present

## 2016-09-26 DIAGNOSIS — M5136 Other intervertebral disc degeneration, lumbar region: Secondary | ICD-10-CM | POA: Diagnosis not present

## 2016-09-26 DIAGNOSIS — M545 Low back pain: Secondary | ICD-10-CM | POA: Diagnosis not present

## 2016-09-26 DIAGNOSIS — R202 Paresthesia of skin: Secondary | ICD-10-CM | POA: Diagnosis not present

## 2016-09-26 DIAGNOSIS — M488X6 Other specified spondylopathies, lumbar region: Secondary | ICD-10-CM | POA: Diagnosis not present

## 2016-09-26 DIAGNOSIS — M48061 Spinal stenosis, lumbar region without neurogenic claudication: Secondary | ICD-10-CM | POA: Diagnosis not present

## 2016-09-26 DIAGNOSIS — M79604 Pain in right leg: Secondary | ICD-10-CM | POA: Diagnosis not present

## 2016-09-26 DIAGNOSIS — M4807 Spinal stenosis, lumbosacral region: Secondary | ICD-10-CM | POA: Diagnosis not present

## 2016-10-06 DIAGNOSIS — Z6826 Body mass index (BMI) 26.0-26.9, adult: Secondary | ICD-10-CM | POA: Diagnosis not present

## 2016-10-06 DIAGNOSIS — M5136 Other intervertebral disc degeneration, lumbar region: Secondary | ICD-10-CM | POA: Diagnosis not present

## 2016-10-06 DIAGNOSIS — I10 Essential (primary) hypertension: Secondary | ICD-10-CM | POA: Diagnosis not present

## 2016-10-13 DIAGNOSIS — Z23 Encounter for immunization: Secondary | ICD-10-CM | POA: Diagnosis not present

## 2016-11-07 DIAGNOSIS — E1165 Type 2 diabetes mellitus with hyperglycemia: Secondary | ICD-10-CM | POA: Diagnosis not present

## 2016-11-07 DIAGNOSIS — K210 Gastro-esophageal reflux disease with esophagitis: Secondary | ICD-10-CM | POA: Diagnosis not present

## 2016-11-07 DIAGNOSIS — E782 Mixed hyperlipidemia: Secondary | ICD-10-CM | POA: Diagnosis not present

## 2016-11-09 DIAGNOSIS — Z1389 Encounter for screening for other disorder: Secondary | ICD-10-CM | POA: Diagnosis not present

## 2016-11-09 DIAGNOSIS — F064 Anxiety disorder due to known physiological condition: Secondary | ICD-10-CM | POA: Diagnosis not present

## 2016-11-09 DIAGNOSIS — K210 Gastro-esophageal reflux disease with esophagitis: Secondary | ICD-10-CM | POA: Diagnosis not present

## 2016-11-09 DIAGNOSIS — Z23 Encounter for immunization: Secondary | ICD-10-CM | POA: Diagnosis not present

## 2016-11-09 DIAGNOSIS — E1165 Type 2 diabetes mellitus with hyperglycemia: Secondary | ICD-10-CM | POA: Diagnosis not present

## 2016-11-09 DIAGNOSIS — F324 Major depressive disorder, single episode, in partial remission: Secondary | ICD-10-CM | POA: Diagnosis not present

## 2016-11-09 DIAGNOSIS — M542 Cervicalgia: Secondary | ICD-10-CM | POA: Diagnosis not present

## 2016-12-28 DIAGNOSIS — M5412 Radiculopathy, cervical region: Secondary | ICD-10-CM | POA: Diagnosis not present

## 2017-01-03 DIAGNOSIS — M5022 Other cervical disc displacement, mid-cervical region, unspecified level: Secondary | ICD-10-CM | POA: Diagnosis not present

## 2017-01-03 DIAGNOSIS — R200 Anesthesia of skin: Secondary | ICD-10-CM | POA: Diagnosis not present

## 2017-01-03 DIAGNOSIS — M5412 Radiculopathy, cervical region: Secondary | ICD-10-CM | POA: Diagnosis not present

## 2017-01-03 DIAGNOSIS — M9981 Other biomechanical lesions of cervical region: Secondary | ICD-10-CM | POA: Diagnosis not present

## 2017-01-03 DIAGNOSIS — M47812 Spondylosis without myelopathy or radiculopathy, cervical region: Secondary | ICD-10-CM | POA: Diagnosis not present

## 2017-01-03 DIAGNOSIS — R531 Weakness: Secondary | ICD-10-CM | POA: Diagnosis not present

## 2017-01-25 DIAGNOSIS — Z6825 Body mass index (BMI) 25.0-25.9, adult: Secondary | ICD-10-CM | POA: Diagnosis not present

## 2017-01-25 DIAGNOSIS — M5412 Radiculopathy, cervical region: Secondary | ICD-10-CM | POA: Diagnosis not present

## 2017-02-08 DIAGNOSIS — L03032 Cellulitis of left toe: Secondary | ICD-10-CM | POA: Diagnosis not present

## 2017-02-08 DIAGNOSIS — E119 Type 2 diabetes mellitus without complications: Secondary | ICD-10-CM | POA: Diagnosis not present

## 2017-02-08 DIAGNOSIS — M79675 Pain in left toe(s): Secondary | ICD-10-CM | POA: Diagnosis not present

## 2017-02-08 DIAGNOSIS — Z79899 Other long term (current) drug therapy: Secondary | ICD-10-CM | POA: Diagnosis not present

## 2017-02-08 DIAGNOSIS — Z7984 Long term (current) use of oral hypoglycemic drugs: Secondary | ICD-10-CM | POA: Diagnosis not present

## 2017-02-08 DIAGNOSIS — Z6825 Body mass index (BMI) 25.0-25.9, adult: Secondary | ICD-10-CM | POA: Diagnosis not present

## 2017-02-08 DIAGNOSIS — M25512 Pain in left shoulder: Secondary | ICD-10-CM | POA: Diagnosis not present

## 2017-02-08 DIAGNOSIS — M79602 Pain in left arm: Secondary | ICD-10-CM | POA: Diagnosis not present

## 2017-02-08 DIAGNOSIS — F329 Major depressive disorder, single episode, unspecified: Secondary | ICD-10-CM | POA: Diagnosis not present

## 2017-02-08 DIAGNOSIS — I4892 Unspecified atrial flutter: Secondary | ICD-10-CM | POA: Diagnosis not present

## 2017-02-08 DIAGNOSIS — Z87891 Personal history of nicotine dependence: Secondary | ICD-10-CM | POA: Diagnosis not present

## 2017-02-10 DIAGNOSIS — R51 Headache: Secondary | ICD-10-CM | POA: Diagnosis not present

## 2017-02-10 DIAGNOSIS — F064 Anxiety disorder due to known physiological condition: Secondary | ICD-10-CM | POA: Diagnosis not present

## 2017-02-10 DIAGNOSIS — F449 Dissociative and conversion disorder, unspecified: Secondary | ICD-10-CM | POA: Diagnosis not present

## 2017-02-10 DIAGNOSIS — I10 Essential (primary) hypertension: Secondary | ICD-10-CM | POA: Diagnosis not present

## 2017-02-10 DIAGNOSIS — G3184 Mild cognitive impairment, so stated: Secondary | ICD-10-CM | POA: Diagnosis not present

## 2017-02-21 ENCOUNTER — Other Ambulatory Visit: Payer: Self-pay | Admitting: Neurosurgery

## 2017-03-07 NOTE — Pre-Procedure Instructions (Signed)
JOSHUE BADAL  03/07/2017      [facility] - [place] - [address] [place] [address] Phone: [phone] Fax: [phone]    Your procedure is scheduled on Thursday, April 19th   Report to [facility] Admitting at 6:00 AM   Call this number if you have problems the [facility] of surgery:  [phone], Otherwise call [phone] from 8-4:30 Mon - Fri   Remember:  Do not eat food or drink liquids after midnight Wednesday.   Take these medicines the morning of surgery with A SIP OF WATER :  Omeprazole, Baclofen.              4-5 days prior to surgery, STOP taking any Vitamins, Herbal Supplements, Anti-inflammatories.              DO NOT TAKE ANY diabetes medication the MORNING OF SURGERY>   Do not wear jewelry - no rings or watches.  Do not wear lotions, colognes or deoderant.             Men may shave face and neck.   Do not bring valuables to the hospital.  [facility] is not responsible for any belongings or valuables.  Contacts, dentures or bridgework may not be worn into surgery.  Leave your suitcase in the car.  After surgery it may be brought to your room.  For patients admitted to the hospital, discharge time will be determined by your treatment team.  Please read over the following fact sheets that you were given. Pain Booklet, MRSA Information and Surgical Site Infection Prevention

## 2017-03-08 ENCOUNTER — Other Ambulatory Visit: Payer: Self-pay

## 2017-03-08 ENCOUNTER — Encounter (HOSPITAL_COMMUNITY)
Admission: RE | Admit: 2017-03-08 | Discharge: 2017-03-08 | Disposition: A | Discharge status: Home / Self Care | Payer: Medicare Other | Source: Ambulatory Visit | Attending: Neurosurgery | Admitting: Neurosurgery

## 2017-03-08 ENCOUNTER — Encounter (HOSPITAL_COMMUNITY): Payer: Self-pay

## 2017-03-08 ENCOUNTER — Emergency Department (HOSPITAL_COMMUNITY): Payer: Medicare Other

## 2017-03-08 ENCOUNTER — Emergency Department (HOSPITAL_COMMUNITY)
Admission: EM | Admit: 2017-03-08 | Discharge: 2017-03-08 | Disposition: A | Discharge status: Home / Self Care | Payer: Medicare Other | Attending: Emergency Medicine | Admitting: Emergency Medicine

## 2017-03-08 DIAGNOSIS — Z87891 Personal history of nicotine dependence: Secondary | ICD-10-CM | POA: Insufficient documentation

## 2017-03-08 DIAGNOSIS — Z7984 Long term (current) use of oral hypoglycemic drugs: Secondary | ICD-10-CM | POA: Insufficient documentation

## 2017-03-08 DIAGNOSIS — R413 Other amnesia: Secondary | ICD-10-CM | POA: Insufficient documentation

## 2017-03-08 DIAGNOSIS — E1165 Type 2 diabetes mellitus with hyperglycemia: Secondary | ICD-10-CM | POA: Diagnosis not present

## 2017-03-08 DIAGNOSIS — R000 Tachycardia, unspecified: Secondary | ICD-10-CM | POA: Insufficient documentation

## 2017-03-08 DIAGNOSIS — K210 Gastro-esophageal reflux disease with esophagitis: Secondary | ICD-10-CM | POA: Diagnosis not present

## 2017-03-08 DIAGNOSIS — Z79899 Other long term (current) drug therapy: Secondary | ICD-10-CM | POA: Diagnosis not present

## 2017-03-08 DIAGNOSIS — E119 Type 2 diabetes mellitus without complications: Secondary | ICD-10-CM | POA: Diagnosis not present

## 2017-03-08 DIAGNOSIS — I10 Essential (primary) hypertension: Secondary | ICD-10-CM | POA: Diagnosis not present

## 2017-03-08 DIAGNOSIS — Z01818 Encounter for other preprocedural examination: Secondary | ICD-10-CM | POA: Insufficient documentation

## 2017-03-08 DIAGNOSIS — E782 Mixed hyperlipidemia: Secondary | ICD-10-CM | POA: Diagnosis not present

## 2017-03-08 DIAGNOSIS — R9431 Abnormal electrocardiogram [ECG] [EKG]: Secondary | ICD-10-CM | POA: Diagnosis present

## 2017-03-08 LAB — URINALYSIS, ROUTINE W REFLEX MICROSCOPIC
BACTERIA UA: NONE SEEN
Bilirubin Urine: NEGATIVE
Hgb urine dipstick: NEGATIVE
Ketones, ur: NEGATIVE mg/dL
Leukocytes, UA: NEGATIVE
Nitrite: NEGATIVE
PH: 5 (ref 5.0–8.0)
PROTEIN: NEGATIVE mg/dL
SQUAMOUS EPITHELIAL / LPF: NONE SEEN
Specific Gravity, Urine: 1.007 (ref 1.005–1.030)
WBC, UA: NONE SEEN WBC/hpf (ref 0–5)

## 2017-03-08 LAB — I-STAT TROPONIN, ED: TROPONIN I, POC: 0 ng/mL (ref 0.00–0.08)

## 2017-03-08 LAB — RAPID URINE DRUG SCREEN, HOSP PERFORMED
AMPHETAMINES: NOT DETECTED
BENZODIAZEPINES: NOT DETECTED
Barbiturates: NOT DETECTED
COCAINE: NOT DETECTED
Opiates: NOT DETECTED
Tetrahydrocannabinol: NOT DETECTED

## 2017-03-08 LAB — CBC
HEMATOCRIT: 41.3 % (ref 39.0–52.0)
HEMOGLOBIN: 13.9 g/dL (ref 13.0–17.0)
MCH: 30 pg (ref 26.0–34.0)
MCHC: 33.7 g/dL (ref 30.0–36.0)
MCV: 89.2 fL (ref 78.0–100.0)
Platelets: 216 10*3/uL (ref 150–400)
RBC: 4.63 MIL/uL (ref 4.22–5.81)
RDW: 13 % (ref 11.5–15.5)
WBC: 9 10*3/uL (ref 4.0–10.5)

## 2017-03-08 LAB — BASIC METABOLIC PANEL
ANION GAP: 15 (ref 5–15)
BUN: 11 mg/dL (ref 6–20)
CO2: 23 mmol/L (ref 22–32)
Calcium: 9.7 mg/dL (ref 8.9–10.3)
Chloride: 99 mmol/L — ABNORMAL LOW (ref 101–111)
Creatinine, Ser: 0.94 mg/dL (ref 0.61–1.24)
GFR calc Af Amer: >60 mL/min (ref >60)
GFR calc non Af Amer: >60 mL/min (ref >60)
GLUCOSE: 268 mg/dL — AB (ref 65–99)
POTASSIUM: 3.7 mmol/L (ref 3.5–5.1)
Sodium: 137 mmol/L (ref 135–145)

## 2017-03-08 LAB — GLUCOSE, CAPILLARY: Glucose-Capillary: 246 mg/dL — ABNORMAL HIGH (ref 65–99)

## 2017-03-08 LAB — T4, FREE: FREE T4: 0.94 ng/dL (ref 0.61–1.12)

## 2017-03-08 LAB — MAGNESIUM: Magnesium: 1.7 mg/dL (ref 1.7–2.4)

## 2017-03-08 LAB — TSH: TSH: 1.514 u[IU]/mL (ref 0.350–4.500)

## 2017-03-08 MED ORDER — METOPROLOL TARTRATE 5 MG/5ML IV SOLN
5.0000 mg | Freq: Once | INTRAVENOUS | Status: AC
  Administered 2017-03-08: 5 mg via INTRAVENOUS
  Filled 2017-03-08: qty 5

## 2017-03-08 MED ORDER — METOPROLOL TARTRATE 25 MG PO TABS
12.5000 mg | ORAL_TABLET | Freq: Once | ORAL | Status: AC
  Administered 2017-03-08: 12.5 mg via ORAL
  Filled 2017-03-08: qty 1

## 2017-03-08 MED ORDER — METOPROLOL TARTRATE 25 MG PO TABS: 12.5000 mg | ORAL_TABLET | Freq: Two times a day (BID) | ORAL | 0 refills | Status: DC

## 2017-03-08 NOTE — ED Triage Notes (Signed)
Pt presents from short stay pre op appointment for ekg changes. Pt presents in ST rhythm, rate of 150. Pt reports has had productive cough with yellow mucus x 3 days. Denies CP/SOB. Denies urinary symptoms, denies fevers at home. Reports felt well this AM. Had similar event 3/15 at [facility] hospital.

## 2017-03-08 NOTE — Progress Notes (Signed)
Anesthesia consult.   Pt is a 61 year old male scheduled for C5-6, C6-7 ACDF on 03/15/2017 with Julio Sicks, MD.   Called to see pt in PAT for rapid heart rate.  HR upon arrival to PAT reported to be 130's.  EKG done in PAT showed ST at 150bpm (unfortunately done with wrong pt ID and it has been deleted from system).    Pt with some dementia/memory issues, obtaining history is a challenge.  Pt believes he started feeling his heart rate around 1pm today.  He was sitting in an uncomfortable chair and his back hurt; he thought tachycardia was pain related.  Denies SOB, CP, dizziness, pre-syncope.  Pt does report he feels very hot.    Daughter is with pt. She has some documents related to an ER visit 02/08/17 for the same problem.  Copy of EKG shows ST. Pt reportedly given a dose of metoprolol and sent home.  Saw PCP Fara Chute, MD at [facility] in [place] for f/u; pt doesn't remember outcome of that visit and daughter does not know.  Will attempt to get records.    On exam, pt alert, calm.  Heart rhythm regular, tachycardic.  On monitor, HR 160's.  Pulse ox 100%.   Pt transferred to the ER on monitor.   I notified Erie Noe in Dr. Lindalou Hose office.   Rica Mast, FNP-BC [facility]/Anesthesiology Phone: [phone] 03/08/2017 4:16 PM

## 2017-03-08 NOTE — Progress Notes (Signed)
Patient taken to ED, daughter at bedside.  EKG monitor continues to show St rate of 152 Placed in room 37 in ED, report given.

## 2017-03-08 NOTE — ED Provider Notes (Signed)
[facility]-EMERGENCY DEPT Provider Note   CSN: [id number] Arrival date & time: 03/08/17  1621     History   Chief Complaint Chief Complaint  Patient presents with  . Abnormal ECG    HPI Derek Hunter is a 61 y.o. male.  HPI Patient had gone for a preop evaluation for a planned cervical spine procedure. As he was being evaluated it was noted that his heart rate was up to 150s. Patient was referred to the emergency department for evaluation. Patient ports had not had symptoms. He states he felt a little warm on the inside but has not had palpitations, shortness of breath or chest pain. He did however have an episode of tachycardia early in March. , 3/5, [facility]. He reports that all evaluation was negative and he was treated with metoprolol with resolution of symptoms. Patient denies that he was discharged on metoprolol. Past Medical History:  Diagnosis Date  . Achalasia of esophagus t-5  . Anxiety   . Depression   . Diabetes mellitus   . Memory loss     There are no active problems to display for this patient.   Past Surgical History:  Procedure Laterality Date  . APPENDECTOMY         Home Medications    Prior to Admission medications   Medication Sig Start Date End Date Taking? Authorizing Provider  amitriptyline (ELAVIL) 25 MG tablet Take 25 mg by mouth at bedtime. 02/07/17  Yes Historical Provider, MD  baclofen (LIORESAL) 10 MG tablet Take 10 mg by mouth 2 (two) times daily.   Yes Historical Provider, MD  clonazePAM (KLONOPIN) 0.5 MG tablet Take 0.5 mg by mouth every 6 (six) hours as needed for anxiety.  02/10/17  Yes Historical Provider, MD  diclofenac sodium (VOLTAREN) 1 % GEL Apply 1 application topically daily as needed for pain. 02/10/17  Yes Historical Provider, MD  GLIPIZIDE XL 5 MG 24 hr tablet Take 5 mg by mouth 2 (two) times daily. 02/09/17  Yes Historical Provider, MD  metFORMIN (GLUCOPHAGE) 1000 MG tablet Take 1,000 mg by mouth 2 (two) times daily with a  meal.   Yes Historical Provider, MD  omeprazole (PRILOSEC) 40 MG capsule Take 40 mg by mouth every evening. 02/07/17  Yes Historical Provider, MD  PARoxetine (PAXIL) 20 MG tablet Take 30 mg by mouth at bedtime.    Yes Historical Provider, MD  pioglitazone (ACTOS) 15 MG tablet Take 15 mg by mouth daily.   Yes Historical Provider, MD  metoprolol tartrate (LOPRESSOR) 25 MG tablet Take 0.5 tablets (12.5 mg total) by mouth 2 (two) times daily. 03/08/17   Arby Barrette, MD    Family History No family history on file.  Social History Social History  Substance Use Topics  . Smoking status: Former Games developer  . Smokeless tobacco: Not on file  . Alcohol use No     Allergies   Penicillins   Review of Systems Review of Systems 10 Systems reviewed and are negative for acute change except as noted in the HPI.   Physical Exam Updated Vital Signs BP 126/78   Pulse 89   Temp 99.2 F (37.3 C) (Oral)   Resp 14   SpO2 95%   Physical Exam  Constitutional: He is oriented to person, place, and time. He appears well-developed and well-nourished.  HENT:  Head: Normocephalic and atraumatic.  Mouth/Throat: Oropharynx is clear and moist.  Eyes: Conjunctivae are normal. Pupils are equal, round, and reactive to light.  Neck: Neck  supple.  Cardiovascular: Normal rate.   No murmur heard. Tachycardia.  Pulmonary/Chest: Effort normal and breath sounds normal. No respiratory distress.  Abdominal: Soft. He exhibits no distension. There is no tenderness. There is no guarding.  Musculoskeletal: Normal range of motion. He exhibits no edema or tenderness.  Neurological: He is alert and oriented to person, place, and time. No cranial nerve deficit. He exhibits normal muscle tone. Coordination normal.  Skin: Skin is warm and dry.  Psychiatric: He has a normal mood and affect.  Nursing note and vitals reviewed.    ED Treatments / Results  Labs (all labs ordered are listed, but only abnormal results are  displayed) Labs Reviewed  BASIC METABOLIC PANEL - Abnormal; Notable for the following:       Result Value   Chloride 99 (*)    Glucose, Bld 268 (*)    All other components within normal limits  URINALYSIS, ROUTINE W REFLEX MICROSCOPIC - Abnormal; Notable for the following:    Color, Urine STRAW (*)    Glucose, UA >=500 (*)    All other components within normal limits  CBC  MAGNESIUM  TSH  T4, FREE  RAPID URINE DRUG SCREEN, HOSP PERFORMED  I-STAT TROPOININ, ED    EKG EKG #1 Sinus tachycardia 145 PR 137 QRS 82 QTC 431 Regular with no ischemic appearance. EKG #2 Sinus rhythm 101 PR 188 QRS 86 QTC 406  no ischemic changes.  Radiology Dg Chest Portable 1 View  Result Date: 03/08/2017 CLINICAL DATA:  Sinus tachycardia. EXAM: PORTABLE CHEST 1 VIEW COMPARISON:  None FINDINGS: The heart size and mediastinal contours are within normal limits. Both lungs are clear. The visualized skeletal structures are unremarkable. IMPRESSION: No active disease. Electronically Signed   By: Signa Kell M.D.   On: 03/08/2017 16:39    Procedures Procedures (including critical care time)  Medications Ordered in ED Medications  metoprolol (LOPRESSOR) injection 5 mg (5 mg Intravenous Given 03/08/17 1652)  metoprolol (LOPRESSOR) injection 5 mg (5 mg Intravenous Given 03/08/17 1804)  metoprolol tartrate (LOPRESSOR) tablet 12.5 mg (12.5 mg Oral Given 03/08/17 1855)     Initial Impression / Assessment and Plan / ED Course  I have reviewed the triage vital signs and the nursing notes.  Pertinent labs & imaging results that were available during my care of the patient were reviewed by me and considered in my medical decision making (see chart for details).      Final Clinical Impressions(s) / ED Diagnoses   Final diagnoses:  Tachycardia   Patient presents with tachycardia. He has been essentially asymptomatic with these episodes. This was incidentally identified today when going in for  medical clearance for a scheduled cervicals spine procedure. Patient responded well to metoprolol. Rate slowed down and first EKG although rapid appears to be sinus. Consideration given to atrial flutter however as rhythm slowed, no appearance of flutter waves. Repeat after metoprolol shows a sinus rhythm with no ischemic changes. Patient is clinically well and diagnostic studies are within normal limits. At this time, is unclear why he is developing periodic episodes of tachycardia. This occurred earlier in March and resolved with IV metoprolol which was not continued on a daily basis. At this time I will have the patient continue twice daily metoprolol and schedule follow-up with cardiology. Signs and symptoms for which return are reviewed. New Prescriptions New Prescriptions   METOPROLOL TARTRATE (LOPRESSOR) 25 MG TABLET    Take 0.5 tablets (12.5 mg total) by mouth 2 (two) times  daily.     Arby Barrette, MD 03/08/17 [phone]

## 2017-03-09 DIAGNOSIS — K220 Achalasia of cardia: Secondary | ICD-10-CM

## 2017-03-09 HISTORY — DX: Achalasia of cardia: K22.0

## 2017-03-12 DIAGNOSIS — F064 Anxiety disorder due to known physiological condition: Secondary | ICD-10-CM | POA: Diagnosis not present

## 2017-03-12 DIAGNOSIS — K210 Gastro-esophageal reflux disease with esophagitis: Secondary | ICD-10-CM | POA: Diagnosis not present

## 2017-03-12 DIAGNOSIS — M542 Cervicalgia: Secondary | ICD-10-CM | POA: Diagnosis not present

## 2017-03-12 DIAGNOSIS — E1165 Type 2 diabetes mellitus with hyperglycemia: Secondary | ICD-10-CM | POA: Diagnosis not present

## 2017-03-12 DIAGNOSIS — I10 Essential (primary) hypertension: Secondary | ICD-10-CM

## 2017-03-12 DIAGNOSIS — F324 Major depressive disorder, single episode, in partial remission: Secondary | ICD-10-CM | POA: Diagnosis not present

## 2017-03-12 DIAGNOSIS — G3184 Mild cognitive impairment, so stated: Secondary | ICD-10-CM | POA: Diagnosis not present

## 2017-03-12 DIAGNOSIS — E782 Mixed hyperlipidemia: Secondary | ICD-10-CM | POA: Diagnosis not present

## 2017-03-12 HISTORY — DX: Essential (primary) hypertension: I10

## 2017-03-13 ENCOUNTER — Ambulatory Visit (INDEPENDENT_AMBULATORY_CARE_PROVIDER_SITE_OTHER): Payer: Medicare Other | Admitting: Interventional Cardiology

## 2017-03-13 ENCOUNTER — Encounter: Payer: Self-pay | Admitting: Interventional Cardiology

## 2017-03-13 VITALS — BP 132/64 | HR 157 | Ht 72.0 in | Wt 193.0 lb

## 2017-03-13 DIAGNOSIS — I479 Paroxysmal tachycardia, unspecified: Secondary | ICD-10-CM | POA: Insufficient documentation

## 2017-03-13 DIAGNOSIS — E782 Mixed hyperlipidemia: Secondary | ICD-10-CM | POA: Diagnosis not present

## 2017-03-13 DIAGNOSIS — Z01810 Encounter for preprocedural cardiovascular examination: Secondary | ICD-10-CM

## 2017-03-13 DIAGNOSIS — R000 Tachycardia, unspecified: Secondary | ICD-10-CM

## 2017-03-13 DIAGNOSIS — E785 Hyperlipidemia, unspecified: Secondary | ICD-10-CM | POA: Insufficient documentation

## 2017-03-13 MED ORDER — METOPROLOL TARTRATE 25 MG PO TABS: 25.0000 mg | ORAL_TABLET | Freq: Two times a day (BID) | ORAL | 3 refills | Status: DC

## 2017-03-13 NOTE — Patient Instructions (Addendum)
Medication Instructions:  INCREASE metoprolol to 25 mg twice a day.   Labwork: None ordered  Testing/Procedures: None ordered  Follow-Up: Your physician recommends that you schedule a follow-up appointment in: 3 weeks with Cardiology in [place].   Any Other Special Instructions Will Be Listed Below (If Applicable).     If you need a refill on your cardiac medications before your next appointment, please call your pharmacy.

## 2017-03-13 NOTE — Progress Notes (Signed)
Cardiology Office Note   Date:  03/13/2017   ID:  Derek Hunter, DOB 1955/12/18, MRN [id number]  PCP:  Estanislado Pandy, MD    No chief complaint on file.    Wt Readings from Last 3 Encounters:  03/13/17 193 lb (87.5 kg)  03/08/17 194 lb 1.6 oz (88 kg)  05/06/15 194 lb (88 kg)       History of Present Illness: Derek Hunter is a 61 y.o. male who is being seen today for the evaluation of tachycardia at the request of Sasser, Clarene Critchley, MD.  The patient is scheduled to undergo before meals 5-6, C6-7 anterior cervical fusion under general anesthesia with Dr. Jordan Likes.    Per the prior records which I reviewed today, the patient has some dementia/memory issues. His daughter has been helpful with obtaining history. He was seen in March 2018 for sinus tachycardia in the emergency room. He was given metoprolol and sent home. On multiple occasions, he has denied shortness of breath, chest pain, lightheadedness.  TH eIV metoprolol worked well.    They were sent home with an Rx for metoprolol but he has not started this yet.   He was taking red yeast rice, and stopped this and changed to simvastatin.    He feels anxious in anticipation of surgery.     Past Medical History:  Diagnosis Date  . Achalasia of esophagus t-5  . Anxiety   . Depression   . Diabetes mellitus   . Hypertension 03/12/2017  . Memory loss     Past Surgical History:  Procedure Laterality Date  . APPENDECTOMY       Current Outpatient Prescriptions  Medication Sig Dispense Refill  . amitriptyline (ELAVIL) 25 MG tablet Take 25 mg by mouth at bedtime.    . baclofen (LIORESAL) 10 MG tablet Take 10 mg by mouth 2 (two) times daily.    . clonazePAM (KLONOPIN) 0.5 MG tablet Take 0.5 mg by mouth every 6 (six) hours as needed for anxiety.     . diclofenac sodium (VOLTAREN) 1 % GEL Apply 1 application topically daily as needed for pain.    Marland Kitchen GLIPIZIDE XL 5 MG 24 hr tablet Take 5 mg by mouth 2 (two) times daily.    .  metFORMIN (GLUCOPHAGE) 1000 MG tablet Take 1,000 mg by mouth 2 (two) times daily with a meal.    . omeprazole (PRILOSEC) 40 MG capsule Take 40 mg by mouth every evening.    Marland Kitchen PARoxetine (PAXIL) 20 MG tablet Take 30 mg by mouth at bedtime.     . pioglitazone (ACTOS) 15 MG tablet Take 15 mg by mouth daily.    . metoprolol tartrate (LOPRESSOR) 25 MG tablet Take 1 tablet (25 mg total) by mouth 2 (two) times daily. 180 tablet 3   No current facility-administered medications for this visit.     Allergies:   Penicillins    Social History:  The patient  reports that he has quit smoking. He has never used smokeless tobacco. He reports that he does not drink alcohol or use drugs.   Family History:  The patient's family history includes Valvular heart disease in his mother.    ROS:  Please see the history of present illness.   Otherwise, review of systems are positive for neck pain.   All other systems are reviewed and negative.    PHYSICAL EXAM: VS:  BP 132/64   Pulse (!) 157   Ht 6' (1.829 m)  Wt 193 lb (87.5 kg)   BMI 26.18 kg/m  , BMI Body mass index is 26.18 kg/m. GEN: Well nourished, well developed, in no acute distress  HEENT: normal  Neck: no JVD, carotid bruits, or masses Cardiac: RRR; no murmurs, rubs, or gallops,no edema  Respiratory:  clear to auscultation bilaterally, normal work of breathing GI: soft, nontender, nondistended, + BS MS: no deformity or atrophy  Skin: warm and dry, no rash Neuro:  Strength and sensation are intact Psych: euthymic mood, full affect   EKG:   The ekg ordered today demonstrates sinus tachycardia   Recent Labs: 03/08/2017: BUN 11; Creatinine, Ser 0.94; Hemoglobin 13.9; Magnesium 1.7; Platelets 216; Potassium 3.7; Sodium 137; TSH 1.514   Lipid Panel No results found for: CHOL, TRIG, HDL, CHOLHDL, VLDL, LDLCALC, LDLDIRECT   Other studies Reviewed: Additional studies/ records that were reviewed today with results demonstrating: Multiple ECG  is reviewed. They show different heart rates but all with sinus tachycardia.   ASSESSMENT AND PLAN:  1. Inappropriate sinus tachycardia:  This is become more of an issue over the past few months. Thyroid function normal. Unclear etiology. He thinks anxiety and pain may be playing a part. I recommended that he start his metoprolol 25 mg twice a day. If this goes untreated, he can cause systolic dysfunction and congestive heart failure. He is not showing any signs of heart failure at this time. No chest discomfort. He will need his metoprolol up titrated based on his heart rate and blood pressure.   2. Preoperative cardiovascular examination: He is able to walk up a flight of stairs without chest discomfort or shortness of breath. He does not feel palpitations despite his eye heart rates. No further cardiac testing needed before surgery. He may need IV metoprolol at times during the procedure. Apparently, this worked well in the emergency room. Hopefully, with oral metoprolol, his heart rate will be better at the time of surgery. 3. Hyperlipidemia: He was switched from red yeast Rice to simvastatin. Simvastatin will give him a more consistent lipid-lowering treatment. He will need liver function tests followed. 4. Follow-up appointment in [place] office in about 3 weeks. If heart rate remains uncontrolled, echocardiogram may need to be considered in the future.   Current medicines are reviewed at length with the patient today.  The patient concerns regarding his medicines were addressed.  The following changes have been made:  Start metoprolol  Labs/ tests ordered today include:   Orders Placed This Encounter  Procedures  . EKG 12-Lead    Recommend 150 minutes/week of aerobic exercise Low fat, low carb, high fiber diet recommended  Disposition:   FU in [place] office per his preference   Signed, Lance Muss, MD  03/13/2017 3:30 PM    [facility] [address] [place],  [place], [place]  [address] Phone: [phone]; Fax: [phone]

## 2017-03-14 ENCOUNTER — Encounter (HOSPITAL_COMMUNITY): Payer: Self-pay | Admitting: *Deleted

## 2017-03-14 NOTE — Progress Notes (Signed)
Anesthesia Chart Review:  Pt is a 61 year old male scheduled for C5-6, C6-7 ACDF on 03/15/2017 with Derek Sicks, MD.   - PCP is Derek Chute, MD  Pt is a same day work up.  He was scheduled for PAT 03/08/17, but on arrival to PAT was found to be persistently tachycardic and was sent to ER for eval. Was discharged on metoprolol when no concerning issues identified, but pt did not take metoprolol at home.   Saw Derek Muss, MD with cardiology for follow up on 03/13/17.  Diagnosed with inappropriate sinus tachycardia based on multiple tachycardic episodes with no clear etiology.  Pt instructed to start metoprolol. Cleared for surgery without further testing.  Notes states "He may need IV metoprolol at times during the procedure. Apparently, this worked well in the emergency room. Hopefully, with oral metoprolol, his heart rate will be better at the time of surgery." Pt to f/u with cardiology for metoprolol titration after surgery.    PMH includes:  HTN, tachycardia, DM, memory impairment. Former smoker. BMI 26.    Medications include: Glipizide, metformin, Prilosec, pioglitazone. Pt instructed by Dr. Eldridge Dace to start metoprolol.   Labs from ED visit 03/08/17 reviewed and acceptable for surgery.   1 view CXR 03/08/17: No active disease.  EKG 03/13/17: sinus tachycardia (157 bpm)  If HR acceptable DOS, I anticipate pt can proceed as scheduled.   Rica Mast, FNP-BC [facility]/Anesthesiology Phone: [phone] 03/14/2017 1:05 PM

## 2017-03-14 NOTE — Progress Notes (Signed)
Pt SDW-Pre-op call completed by both pt and pt daughter, Sherri Sear with pt verbal consent. Pt denies SOB and chest pain. Pt under the care of Dr. Eldridge Dace, Cardiology. Pt daughter denies that pt had a cardiac cath but stated that a stress test was performed at [facility] 10 years ago. Both pt and pt daughter not sure if an echo was performed. Both daughter and pt made aware of the importance that the pt takes the Metoprolol as scheduled including the morning of surgery. Both daughter and pt made aware that the Metoprolol must be taken the morning pf surgery to prevent the pt from having a high heart rate; if the heart rate is high, surgery may be postponed. Both daughter and pt verbalized understanding. Daughter made aware of diabetes protocol to not take Glipizide tonight and DOS, to not take Metformin and Actos or any diabetes pills the morning of surgery, check BG every 2 hours prior to arrival to the hospital the DOS, drink 4 ounces of apple juice or cranberry juice to treat a BG < 70, wait 15 minutes after drinking juice, if BG remains < 70 or is > 220, call the SS unit. Daughter made aware to have pt stop taking   Aspirin, vitamins, fish oil and herbal medications. Do not take any NSAIDs ie: Ibuprofen, Advil, Naproxen, BC and Goody Powder or any medication containing Aspirin. Daughter verbalized understanding of all pre-op instructions.

## 2017-03-15 ENCOUNTER — Inpatient Hospital Stay (HOSPITAL_COMMUNITY): Payer: Medicare Other | Admitting: Anesthesiology

## 2017-03-15 ENCOUNTER — Inpatient Hospital Stay (HOSPITAL_COMMUNITY): Payer: Medicare Other | Admitting: Emergency Medicine

## 2017-03-15 ENCOUNTER — Inpatient Hospital Stay (HOSPITAL_COMMUNITY)
Admission: RE | Admit: 2017-03-15 | Discharge: 2017-03-16 | DRG: 473 | Disposition: A | Discharge status: Home / Self Care | Payer: Medicare Other | Source: Ambulatory Visit | Attending: Neurosurgery | Admitting: Neurosurgery

## 2017-03-15 ENCOUNTER — Inpatient Hospital Stay (HOSPITAL_COMMUNITY): Payer: Medicare Other

## 2017-03-15 ENCOUNTER — Encounter (HOSPITAL_COMMUNITY): Payer: Self-pay | Admitting: Anesthesiology

## 2017-03-15 ENCOUNTER — Encounter (HOSPITAL_COMMUNITY)
Admission: RE | Disposition: A | Discharge status: Home / Self Care | Payer: Self-pay | Source: Ambulatory Visit | Attending: Neurosurgery

## 2017-03-15 DIAGNOSIS — F329 Major depressive disorder, single episode, unspecified: Secondary | ICD-10-CM | POA: Diagnosis present

## 2017-03-15 DIAGNOSIS — Z79899 Other long term (current) drug therapy: Secondary | ICD-10-CM

## 2017-03-15 DIAGNOSIS — M4802 Spinal stenosis, cervical region: Principal | ICD-10-CM | POA: Diagnosis present

## 2017-03-15 DIAGNOSIS — I10 Essential (primary) hypertension: Secondary | ICD-10-CM | POA: Diagnosis present

## 2017-03-15 DIAGNOSIS — Z87891 Personal history of nicotine dependence: Secondary | ICD-10-CM

## 2017-03-15 DIAGNOSIS — Z7984 Long term (current) use of oral hypoglycemic drugs: Secondary | ICD-10-CM

## 2017-03-15 DIAGNOSIS — Z880 Allergy status to penicillin: Secondary | ICD-10-CM

## 2017-03-15 DIAGNOSIS — K219 Gastro-esophageal reflux disease without esophagitis: Secondary | ICD-10-CM | POA: Diagnosis present

## 2017-03-15 DIAGNOSIS — E119 Type 2 diabetes mellitus without complications: Secondary | ICD-10-CM | POA: Diagnosis present

## 2017-03-15 DIAGNOSIS — M4722 Other spondylosis with radiculopathy, cervical region: Secondary | ICD-10-CM | POA: Diagnosis present

## 2017-03-15 DIAGNOSIS — F419 Anxiety disorder, unspecified: Secondary | ICD-10-CM | POA: Diagnosis present

## 2017-03-15 DIAGNOSIS — M79601 Pain in right arm: Secondary | ICD-10-CM | POA: Diagnosis not present

## 2017-03-15 DIAGNOSIS — E785 Hyperlipidemia, unspecified: Secondary | ICD-10-CM | POA: Diagnosis not present

## 2017-03-15 DIAGNOSIS — M5022 Other cervical disc displacement, mid-cervical region, unspecified level: Secondary | ICD-10-CM | POA: Diagnosis not present

## 2017-03-15 DIAGNOSIS — Z419 Encounter for procedure for purposes other than remedying health state, unspecified: Secondary | ICD-10-CM

## 2017-03-15 HISTORY — DX: Spinal stenosis, cervical region: M48.02

## 2017-03-15 HISTORY — PX: ANTERIOR CERVICAL DECOMP/DISCECTOMY FUSION: SHX1161

## 2017-03-15 HISTORY — DX: Gastro-esophageal reflux disease without esophagitis: K21.9

## 2017-03-15 HISTORY — DX: Unspecified osteoarthritis, unspecified site: M19.90

## 2017-03-15 LAB — CBC WITH DIFFERENTIAL/PLATELET
BASOS ABS: 0 10*3/uL (ref 0.0–0.1)
BASOS PCT: 0 %
EOS PCT: 3 %
Eosinophils Absolute: 0.4 10*3/uL (ref 0.0–0.7)
HEMATOCRIT: 38.6 % — AB (ref 39.0–52.0)
Hemoglobin: 13.3 g/dL (ref 13.0–17.0)
LYMPHS PCT: 21 %
Lymphs Abs: 2.3 10*3/uL (ref 0.7–4.0)
MCH: 30.8 pg (ref 26.0–34.0)
MCHC: 34.5 g/dL (ref 30.0–36.0)
MCV: 89.4 fL (ref 78.0–100.0)
MONO ABS: 0.5 10*3/uL (ref 0.1–1.0)
Monocytes Relative: 5 %
NEUTROS ABS: 7.8 10*3/uL — AB (ref 1.7–7.7)
Neutrophils Relative %: 71 %
PLATELETS: 210 10*3/uL (ref 150–400)
RBC: 4.32 MIL/uL (ref 4.22–5.81)
RDW: 12.7 % (ref 11.5–15.5)
WBC: 11 10*3/uL — AB (ref 4.0–10.5)

## 2017-03-15 LAB — GLUCOSE, CAPILLARY
GLUCOSE-CAPILLARY: 301 mg/dL — AB (ref 65–99)
GLUCOSE-CAPILLARY: 274 mg/dL — AB (ref 65–99)
GLUCOSE-CAPILLARY: 147 mg/dL — AB (ref 65–99)
GLUCOSE-CAPILLARY: 142 mg/dL — AB (ref 65–99)
GLUCOSE-CAPILLARY: 247 mg/dL — AB (ref 65–99)
Glucose-Capillary: 127 mg/dL — ABNORMAL HIGH (ref 65–99)
Glucose-Capillary: 149 mg/dL — ABNORMAL HIGH (ref 65–99)
Glucose-Capillary: 156 mg/dL — ABNORMAL HIGH (ref 65–99)
Glucose-Capillary: 166 mg/dL — ABNORMAL HIGH (ref 65–99)
Glucose-Capillary: 247 mg/dL — ABNORMAL HIGH (ref 65–99)
Glucose-Capillary: 273 mg/dL — ABNORMAL HIGH (ref 65–99)
Glucose-Capillary: 281 mg/dL — ABNORMAL HIGH (ref 65–99)

## 2017-03-15 LAB — BASIC METABOLIC PANEL
Anion gap: 10 (ref 5–15)
BUN: 13 mg/dL (ref 6–20)
CALCIUM: 9.1 mg/dL (ref 8.9–10.3)
CO2: 25 mmol/L (ref 22–32)
Chloride: 99 mmol/L — ABNORMAL LOW (ref 101–111)
Creatinine, Ser: 0.87 mg/dL (ref 0.61–1.24)
Glucose, Bld: 302 mg/dL — ABNORMAL HIGH (ref 65–99)
Potassium: 4 mmol/L (ref 3.5–5.1)
SODIUM: 134 mmol/L — AB (ref 135–145)

## 2017-03-15 LAB — SURGICAL PCR SCREEN
MRSA, PCR: NEGATIVE
STAPHYLOCOCCUS AUREUS: NEGATIVE

## 2017-03-15 SURGERY — ANTERIOR CERVICAL DECOMPRESSION/DISCECTOMY FUSION 2 LEVELS
Anesthesia: General

## 2017-03-15 MED ORDER — LACTATED RINGERS IV SOLN
INTRAVENOUS | Status: DC | PRN
  Administered 2017-03-15 (×2): via INTRAVENOUS

## 2017-03-15 MED ORDER — ARTIFICIAL TEARS OP OINT
TOPICAL_OINTMENT | OPHTHALMIC | Status: AC
  Filled 2017-03-15: qty 3.5

## 2017-03-15 MED ORDER — PHENYLEPHRINE 40 MCG/ML (10ML) SYRINGE FOR IV PUSH (FOR BLOOD PRESSURE SUPPORT)
PREFILLED_SYRINGE | INTRAVENOUS | Status: AC
  Filled 2017-03-15: qty 10

## 2017-03-15 MED ORDER — BACLOFEN 10 MG PO TABS
10.0000 mg | ORAL_TABLET | Freq: Two times a day (BID) | ORAL | Status: DC
  Administered 2017-03-15 – 2017-03-16 (×2): 10 mg via ORAL
  Filled 2017-03-15 (×2): qty 1

## 2017-03-15 MED ORDER — BACITRACIN 50000 UNITS IM SOLR
INTRAMUSCULAR | Status: DC | PRN
  Administered 2017-03-15: 09:00:00

## 2017-03-15 MED ORDER — CYCLOBENZAPRINE HCL 10 MG PO TABS
ORAL_TABLET | ORAL | Status: AC
  Filled 2017-03-15: qty 1

## 2017-03-15 MED ORDER — DEXAMETHASONE SODIUM PHOSPHATE 10 MG/ML IJ SOLN
10.0000 mg | INTRAMUSCULAR | Status: AC
  Administered 2017-03-15: 10 mg via INTRAVENOUS
  Filled 2017-03-15: qty 1

## 2017-03-15 MED ORDER — MIDAZOLAM HCL 2 MG/2ML IJ SOLN
INTRAMUSCULAR | Status: AC
  Filled 2017-03-15: qty 2

## 2017-03-15 MED ORDER — INSULIN ASPART 100 UNIT/ML ~~LOC~~ SOLN
0.0000 [IU] | SUBCUTANEOUS | Status: DC
  Administered 2017-03-15: 7 [IU] via SUBCUTANEOUS
  Administered 2017-03-16 (×2): 4 [IU] via SUBCUTANEOUS
  Administered 2017-03-16: 11 [IU] via SUBCUTANEOUS

## 2017-03-15 MED ORDER — ARTIFICIAL TEARS OP OINT
TOPICAL_OINTMENT | OPHTHALMIC | Status: DC | PRN
  Administered 2017-03-15: 1 via OPHTHALMIC

## 2017-03-15 MED ORDER — HYDROCODONE-ACETAMINOPHEN 5-325 MG PO TABS
1.0000 | ORAL_TABLET | ORAL | Status: DC | PRN
  Administered 2017-03-15 – 2017-03-16 (×6): 1 via ORAL
  Filled 2017-03-15 (×5): qty 1

## 2017-03-15 MED ORDER — SODIUM CHLORIDE 0.9 % IV SOLN: 250.0000 mL | INTRAVENOUS | Status: DC

## 2017-03-15 MED ORDER — ONDANSETRON HCL 4 MG PO TABS: 4.0000 mg | ORAL_TABLET | Freq: Four times a day (QID) | ORAL | Status: DC | PRN

## 2017-03-15 MED ORDER — VANCOMYCIN HCL IN DEXTROSE 1-5 GM/200ML-% IV SOLN
INTRAVENOUS | Status: AC
  Filled 2017-03-15: qty 200

## 2017-03-15 MED ORDER — INSULIN ASPART 100 UNIT/ML ~~LOC~~ SOLN: 0.0000 [IU] | Freq: Three times a day (TID) | SUBCUTANEOUS | Status: DC

## 2017-03-15 MED ORDER — AMITRIPTYLINE HCL 25 MG PO TABS
25.0000 mg | ORAL_TABLET | Freq: Every day | ORAL | Status: DC
Start: 2017-03-15 — End: 2017-03-16
  Administered 2017-03-15: 25 mg via ORAL
  Filled 2017-03-15: qty 1

## 2017-03-15 MED ORDER — PAROXETINE HCL 30 MG PO TABS
30.0000 mg | ORAL_TABLET | Freq: Every day | ORAL | Status: DC
  Administered 2017-03-15: 30 mg via ORAL
  Filled 2017-03-15: qty 1

## 2017-03-15 MED ORDER — INSULIN GLARGINE 100 UNIT/ML ~~LOC~~ SOLN
18.0000 [IU] | Freq: Every day | SUBCUTANEOUS | Status: DC
  Administered 2017-03-15 – 2017-03-16 (×2): 18 [IU] via SUBCUTANEOUS
  Filled 2017-03-15 (×2): qty 0.18

## 2017-03-15 MED ORDER — LIDOCAINE 2% (20 MG/ML) 5 ML SYRINGE
INTRAMUSCULAR | Status: AC
  Filled 2017-03-15: qty 5

## 2017-03-15 MED ORDER — PROPOFOL 10 MG/ML IV BOLUS
INTRAVENOUS | Status: AC
  Filled 2017-03-15: qty 40

## 2017-03-15 MED ORDER — PHENYLEPHRINE HCL 10 MG/ML IJ SOLN
INTRAMUSCULAR | Status: DC | PRN
  Administered 2017-03-15: 80 ug via INTRAVENOUS
  Administered 2017-03-15 (×2): 40 ug via INTRAVENOUS
  Administered 2017-03-15: 80 ug via INTRAVENOUS

## 2017-03-15 MED ORDER — PIOGLITAZONE HCL 15 MG PO TABS
15.0000 mg | ORAL_TABLET | Freq: Every day | ORAL | Status: DC
  Administered 2017-03-16: 15 mg via ORAL
  Filled 2017-03-15: qty 1

## 2017-03-15 MED ORDER — VANCOMYCIN HCL IN DEXTROSE 1-5 GM/200ML-% IV SOLN
1000.0000 mg | Freq: Once | INTRAVENOUS | Status: AC
  Administered 2017-03-15: 1000 mg via INTRAVENOUS
  Filled 2017-03-15: qty 200

## 2017-03-15 MED ORDER — MUPIROCIN 2 % EX OINT
1.0000 "application " | TOPICAL_OINTMENT | Freq: Once | CUTANEOUS | Status: AC
  Administered 2017-03-15: 1 via TOPICAL

## 2017-03-15 MED ORDER — ONDANSETRON HCL 4 MG/2ML IJ SOLN: 4.0000 mg | Freq: Four times a day (QID) | INTRAMUSCULAR | Status: DC | PRN

## 2017-03-15 MED ORDER — SODIUM CHLORIDE 0.9% FLUSH
3.0000 mL | Freq: Two times a day (BID) | INTRAVENOUS | Status: DC
Start: 2017-03-15 — End: 2017-03-16
  Administered 2017-03-16: 3 mL via INTRAVENOUS

## 2017-03-15 MED ORDER — 0.9 % SODIUM CHLORIDE (POUR BTL) OPTIME
TOPICAL | Status: DC | PRN
  Administered 2017-03-15: 1000 mL

## 2017-03-15 MED ORDER — PHENOL 1.4 % MT LIQD
1.0000 | OROMUCOSAL | Status: DC | PRN
Start: 2017-03-15 — End: 2017-03-16
  Filled 2017-03-15: qty 177

## 2017-03-15 MED ORDER — THROMBIN 5000 UNITS EX SOLR
CUTANEOUS | Status: AC
  Filled 2017-03-15: qty 10000

## 2017-03-15 MED ORDER — HYDROMORPHONE HCL 1 MG/ML IJ SOLN
INTRAMUSCULAR | Status: AC
  Filled 2017-03-15: qty 0.5

## 2017-03-15 MED ORDER — SIMVASTATIN 20 MG PO TABS
20.0000 mg | ORAL_TABLET | Freq: Every day | ORAL | Status: DC
  Administered 2017-03-15 – 2017-03-16 (×2): 20 mg via ORAL
  Filled 2017-03-15 (×2): qty 1

## 2017-03-15 MED ORDER — SODIUM CHLORIDE 0.9% FLUSH: 3.0000 mL | INTRAVENOUS | Status: DC | PRN

## 2017-03-15 MED ORDER — ROCURONIUM BROMIDE 50 MG/5ML IV SOSY
PREFILLED_SYRINGE | INTRAVENOUS | Status: AC
  Filled 2017-03-15: qty 5

## 2017-03-15 MED ORDER — CHLORHEXIDINE GLUCONATE CLOTH 2 % EX PADS: 6.0000 | MEDICATED_PAD | Freq: Once | CUTANEOUS | Status: DC

## 2017-03-15 MED ORDER — SUCCINYLCHOLINE CHLORIDE 200 MG/10ML IV SOSY
PREFILLED_SYRINGE | INTRAVENOUS | Status: AC
  Filled 2017-03-15: qty 10

## 2017-03-15 MED ORDER — ROCURONIUM BROMIDE 100 MG/10ML IV SOLN
INTRAVENOUS | Status: DC | PRN
  Administered 2017-03-15: 40 mg via INTRAVENOUS
  Administered 2017-03-15 (×3): 10 mg via INTRAVENOUS

## 2017-03-15 MED ORDER — CYCLOBENZAPRINE HCL 10 MG PO TABS
10.0000 mg | ORAL_TABLET | Freq: Three times a day (TID) | ORAL | Status: DC | PRN
  Administered 2017-03-15 (×2): 10 mg via ORAL
  Filled 2017-03-15 (×2): qty 1

## 2017-03-15 MED ORDER — EPHEDRINE 5 MG/ML INJ
INTRAVENOUS | Status: AC
  Filled 2017-03-15: qty 10

## 2017-03-15 MED ORDER — MEPERIDINE HCL 25 MG/ML IJ SOLN: 6.2500 mg | INTRAMUSCULAR | Status: DC | PRN

## 2017-03-15 MED ORDER — LIDOCAINE HCL (CARDIAC) 20 MG/ML IV SOLN
INTRAVENOUS | Status: DC | PRN
  Administered 2017-03-15: 100 mg via INTRAVENOUS

## 2017-03-15 MED ORDER — METOPROLOL TARTRATE 25 MG PO TABS
25.0000 mg | ORAL_TABLET | Freq: Two times a day (BID) | ORAL | Status: DC
  Administered 2017-03-15: 25 mg via ORAL
  Filled 2017-03-15 (×2): qty 1

## 2017-03-15 MED ORDER — MIDAZOLAM HCL 5 MG/5ML IJ SOLN
INTRAMUSCULAR | Status: DC | PRN
  Administered 2017-03-15: 2 mg via INTRAVENOUS

## 2017-03-15 MED ORDER — METFORMIN HCL 500 MG PO TABS
1000.0000 mg | ORAL_TABLET | Freq: Two times a day (BID) | ORAL | Status: DC
  Administered 2017-03-16: 1000 mg via ORAL
  Filled 2017-03-15: qty 2

## 2017-03-15 MED ORDER — GLIPIZIDE ER 5 MG PO TB24
5.0000 mg | ORAL_TABLET | Freq: Two times a day (BID) | ORAL | Status: DC
  Administered 2017-03-16: 5 mg via ORAL
  Filled 2017-03-15 (×2): qty 1

## 2017-03-15 MED ORDER — EPHEDRINE SULFATE 50 MG/ML IJ SOLN
INTRAMUSCULAR | Status: DC | PRN
  Administered 2017-03-15: 5 mg via INTRAVENOUS
  Administered 2017-03-15 (×2): 10 mg via INTRAVENOUS

## 2017-03-15 MED ORDER — HYDROCODONE-ACETAMINOPHEN 5-325 MG PO TABS
ORAL_TABLET | ORAL | Status: AC
  Filled 2017-03-15: qty 1

## 2017-03-15 MED ORDER — MUPIROCIN 2 % EX OINT
TOPICAL_OINTMENT | CUTANEOUS | Status: AC
  Filled 2017-03-15: qty 22

## 2017-03-15 MED ORDER — PANTOPRAZOLE SODIUM 40 MG PO TBEC
40.0000 mg | DELAYED_RELEASE_TABLET | Freq: Every day | ORAL | Status: DC
  Administered 2017-03-15 – 2017-03-16 (×2): 40 mg via ORAL
  Filled 2017-03-15 (×2): qty 1

## 2017-03-15 MED ORDER — VANCOMYCIN HCL IN DEXTROSE 1-5 GM/200ML-% IV SOLN
1000.0000 mg | INTRAVENOUS | Status: AC
  Administered 2017-03-15: 1000 mg via INTRAVENOUS

## 2017-03-15 MED ORDER — FENTANYL CITRATE (PF) 250 MCG/5ML IJ SOLN
INTRAMUSCULAR | Status: AC
  Filled 2017-03-15: qty 5

## 2017-03-15 MED ORDER — SODIUM CHLORIDE 0.9 % IV SOLN
Freq: Once | INTRAVENOUS | Status: AC
  Administered 2017-03-15: 4.8 [IU]/h via INTRAVENOUS
  Filled 2017-03-15: qty 2.5

## 2017-03-15 MED ORDER — THROMBIN 5000 UNITS EX SOLR
CUTANEOUS | Status: DC | PRN
  Administered 2017-03-15 (×2): 5000 [IU] via TOPICAL

## 2017-03-15 MED ORDER — HEMOSTATIC AGENTS (NO CHARGE) OPTIME
TOPICAL | Status: DC | PRN
  Administered 2017-03-15: 1 via TOPICAL

## 2017-03-15 MED ORDER — HYDROMORPHONE HCL 1 MG/ML IJ SOLN: 0.5000 mg | INTRAMUSCULAR | Status: DC | PRN

## 2017-03-15 MED ORDER — ONDANSETRON HCL 4 MG/2ML IJ SOLN
INTRAMUSCULAR | Status: AC
  Filled 2017-03-15: qty 2

## 2017-03-15 MED ORDER — MENTHOL 3 MG MT LOZG
1.0000 | LOZENGE | OROMUCOSAL | Status: DC | PRN
Start: 2017-03-15 — End: 2017-03-16

## 2017-03-15 MED ORDER — ONDANSETRON HCL 4 MG/2ML IJ SOLN: 4.0000 mg | Freq: Once | INTRAMUSCULAR | Status: DC | PRN

## 2017-03-15 MED ORDER — PROPOFOL 10 MG/ML IV BOLUS
INTRAVENOUS | Status: DC | PRN
  Administered 2017-03-15: 200 mg via INTRAVENOUS

## 2017-03-15 MED ORDER — HYDROMORPHONE HCL 1 MG/ML IJ SOLN
0.2500 mg | INTRAMUSCULAR | Status: DC | PRN
  Administered 2017-03-15 (×2): 0.5 mg via INTRAVENOUS

## 2017-03-15 MED ORDER — FENTANYL CITRATE (PF) 100 MCG/2ML IJ SOLN
INTRAMUSCULAR | Status: DC | PRN
  Administered 2017-03-15 (×2): 50 ug via INTRAVENOUS
  Administered 2017-03-15: 125 ug via INTRAVENOUS
  Administered 2017-03-15: 25 ug via INTRAVENOUS

## 2017-03-15 MED ORDER — SUGAMMADEX SODIUM 200 MG/2ML IV SOLN
INTRAVENOUS | Status: DC | PRN
  Administered 2017-03-15: 175 mg via INTRAVENOUS

## 2017-03-15 SURGICAL SUPPLY — 61 items
APL SKNCLS STERI-STRIP NONHPOA (GAUZE/BANDAGES/DRESSINGS) ×1
BAG DECANTER FOR FLEXI CONT (MISCELLANEOUS) ×2 IMPLANT
BENZOIN TINCTURE PRP APPL 2/3 (GAUZE/BANDAGES/DRESSINGS) ×2 IMPLANT
BIT DRILL 13 (BIT) ×1 IMPLANT
BUR MATCHSTICK NEURO 3.0 LAGG (BURR) ×2 IMPLANT
CAGE PEEK 7X14X11 (Cage) ×2 IMPLANT
CANISTER SUCT 3000ML PPV (MISCELLANEOUS) ×2 IMPLANT
CARTRIDGE OIL MAESTRO DRILL (MISCELLANEOUS) ×1 IMPLANT
DIFFUSER DRILL AIR PNEUMATIC (MISCELLANEOUS) ×2 IMPLANT
DRAPE C-ARM 42X72 X-RAY (DRAPES) ×4 IMPLANT
DRAPE LAPAROTOMY 100X72 PEDS (DRAPES) ×2 IMPLANT
DRAPE MICROSCOPE LEICA (MISCELLANEOUS) ×2 IMPLANT
DRAPE POUCH INSTRU U-SHP 10X18 (DRAPES) ×2 IMPLANT
DURAPREP 6ML APPLICATOR 50/CS (WOUND CARE) ×2 IMPLANT
ELECT COATED BLADE 2.86 ST (ELECTRODE) ×2 IMPLANT
ELECT REM PT RETURN 9FT ADLT (ELECTROSURGICAL) ×2
ELECTRODE REM PT RTRN 9FT ADLT (ELECTROSURGICAL) ×1 IMPLANT
GAUZE SPONGE 4X4 12PLY STRL (GAUZE/BANDAGES/DRESSINGS) ×2 IMPLANT
GAUZE SPONGE 4X4 16PLY XRAY LF (GAUZE/BANDAGES/DRESSINGS) IMPLANT
GLOVE BIOGEL PI IND STRL 7.5 (GLOVE) IMPLANT
GLOVE BIOGEL PI INDICATOR 7.5 (GLOVE) ×2
GLOVE ECLIPSE 7.0 STRL STRAW (GLOVE) ×1 IMPLANT
GLOVE ECLIPSE 9.0 STRL (GLOVE) ×2 IMPLANT
GLOVE EXAM NITRILE LRG STRL (GLOVE) IMPLANT
GLOVE EXAM NITRILE XL STR (GLOVE) IMPLANT
GLOVE EXAM NITRILE XS STR PU (GLOVE) IMPLANT
GLOVE SURG SS PI 7.0 STRL IVOR (GLOVE) ×3 IMPLANT
GOWN STRL REUS W/ TWL LRG LVL3 (GOWN DISPOSABLE) IMPLANT
GOWN STRL REUS W/ TWL XL LVL3 (GOWN DISPOSABLE) ×1 IMPLANT
GOWN STRL REUS W/TWL 2XL LVL3 (GOWN DISPOSABLE) IMPLANT
GOWN STRL REUS W/TWL LRG LVL3 (GOWN DISPOSABLE) ×2
GOWN STRL REUS W/TWL XL LVL3 (GOWN DISPOSABLE) ×6
HALTER HD/CHIN CERV TRACTION D (MISCELLANEOUS) ×2 IMPLANT
HEMOSTAT SURGICEL 2X14 (HEMOSTASIS) IMPLANT
KIT BASIN OR (CUSTOM PROCEDURE TRAY) ×2 IMPLANT
KIT ROOM TURNOVER OR (KITS) ×2 IMPLANT
NDL SPNL 20GX3.5 QUINCKE YW (NEEDLE) ×1 IMPLANT
NEEDLE SPNL 20GX3.5 QUINCKE YW (NEEDLE) ×2 IMPLANT
NS IRRIG 1000ML POUR BTL (IV SOLUTION) ×2 IMPLANT
OIL CARTRIDGE MAESTRO DRILL (MISCELLANEOUS) ×2
PACK LAMINECTOMY NEURO (CUSTOM PROCEDURE TRAY) ×2 IMPLANT
PAD ARMBOARD 7.5X6 YLW CONV (MISCELLANEOUS) ×6 IMPLANT
PEEK CAGE 7X14X11 (Cage) ×1 IMPLANT
PLATE 45MM (Plate) ×2 IMPLANT
PLATE 45XATL VS ELT (Plate) IMPLANT
RUBBERBAND STERILE (MISCELLANEOUS) ×4 IMPLANT
SCREW ST 13X4XST VA NS SPNE (Screw) IMPLANT
SCREW ST VAR 4 ATL (Screw) ×12 IMPLANT
SPACER SPNL 11X14X7XPEEK CVD (Cage) IMPLANT
SPCR SPNL 11X14X7XPEEK CVD (Cage) ×1 IMPLANT
SPONGE INTESTINAL PEANUT (DISPOSABLE) ×2 IMPLANT
SPONGE SURGIFOAM ABS GEL SZ50 (HEMOSTASIS) ×2 IMPLANT
STRIP CLOSURE SKIN 1/2X4 (GAUZE/BANDAGES/DRESSINGS) ×2 IMPLANT
SUT VIC AB 3-0 SH 8-18 (SUTURE) ×2 IMPLANT
SUT VIC AB 4-0 RB1 18 (SUTURE) ×2 IMPLANT
TAPE CLOTH 4X10 WHT NS (GAUZE/BANDAGES/DRESSINGS) ×2 IMPLANT
TAPE CLOTH SURG 4X10 WHT LF (GAUZE/BANDAGES/DRESSINGS) ×1 IMPLANT
TOWEL GREEN STERILE (TOWEL DISPOSABLE) ×2 IMPLANT
TOWEL GREEN STERILE FF (TOWEL DISPOSABLE) ×2 IMPLANT
TRAP SPECIMEN MUCOUS 40CC (MISCELLANEOUS) ×2 IMPLANT
WATER STERILE IRR 1000ML POUR (IV SOLUTION) ×2 IMPLANT

## 2017-03-15 NOTE — Anesthesia Procedure Notes (Signed)
Procedure Name: Intubation Date/Time: 03/15/2017 8:25 AM Performed by: Fransisca Kaufmann Pre-anesthesia Checklist: Patient identified, Emergency Drugs available, Suction available and Patient being monitored Patient Re-evaluated:Patient Re-evaluated prior to inductionOxygen Delivery Method: Circle System Utilized Preoxygenation: Pre-oxygenation with 100% oxygen Intubation Type: IV induction Ventilation: Mask ventilation without difficulty Laryngoscope Size: Miller and 2 Grade View: Grade I Tube type: Oral Tube size: 7.5 mm Number of attempts: 1 Airway Equipment and Method: Stylet and Oral airway Placement Confirmation: ETT inserted through vocal cords under direct vision,  positive ETCO2 and breath sounds checked- equal and bilateral Secured at: 23 cm Tube secured with: Tape Dental Injury: Teeth and Oropharynx as per pre-operative assessment

## 2017-03-15 NOTE — Op Note (Signed)
Date of procedure: 03/15/2017  Date of dictation: Same  Service: Neurosurgery  Preoperative diagnosis: Cervical spondylosis with stenosis and radiculopathy  Postoperative diagnosis: Same  Procedure Name: C5-6, C6-7 anterior cervical discectomy with interbody fusion utilizing interbody peek cage, locally harvested autograft, and anterior plate instrumentation  Surgeon:Bay Jarquin A.Keitha Kolk, M.D.  Asst. Surgeon: Conchita Paris  Anesthesia: General  Indication: 61 year old male with severe neck pain with right upper extremity pain. She is an weakness consistent with a mixed cervical radiculopathy. Workup demonstrates evidence of severe spondylosis with stenosis at C5-6 and C6-7. Patient presents now for two-level anterior cervical decompression and fusion.  Operative note: After induction of anesthesia, patient positioned supine with his neck slightly extended and held place of halter traction. Patient's anterior cervical regions prepped draped sterilely. Incision made overlying C6. Dissection performed on the right. Retractor placed. Fluoroscopy used. Levels confirmed. Anterior osteophytes were extensive at C5-6 and C6-7. These were resected. Discectomies then performed with various instruments down to level posterior annulus. Microscope brought field used throughout the remainder of the discectomy. Remaining aspects of annulus and osteophytes removed using high-speed drill down to level the posterior longitudinal ligament. Posterior longitudinal was elevated and resected piecemeal fashion. Underlying thecal sac was identified. Wide central decompression was then performed by undercutting the bodies of C5 and C6. Decompression then proceeded into each neural foramen. Wide anterior foraminotomies performed on course exiting C6 nerve roots bilaterally. At this point a very thorough decompression been achieved. There is no evidence of injury to thecal sac or nerve roots. Procedure then repeated at C6-7 typical  fashion again without complications. Wounds and irrigated MI solution. Medtronic anatomic peek cages were packed with locally harvested autograft. Cage was then impacted into both interspaces and recessed slightly from the anterior cortical margin. Medtronic anterior Atlantis cervical plate was then placed over the C5-C7 levels. This was then applied using 13 monitor Verlie angle screws to reach it all 3 levels. All screws given a final tightening found be solidly within bone. Locking screws engaged L3 levels. Final images revealed good position of the cages and instrumentation at the proper upper level with normal lamina spine. Wounds and irrigated MI solution. Hemostasis was assured. Wound closed in typical fashion. Steri-Strips and sterile dressing were applied. No apparent complications. Patient tolerated the procedure well and he returns to the recovery room postop.

## 2017-03-15 NOTE — Brief Op Note (Signed)
03/15/2017  10:28 AM  PATIENT:  Derek Hunter  61 y.o. male  PRE-OPERATIVE DIAGNOSIS:  Spondylosis - stenosis  POST-OPERATIVE DIAGNOSIS:  Spondylosis - stenosis  PROCEDURE:  Procedure(s): Anterior Cervical Discectomy Fusion - Cervical five- Cervical six - Cervical six- Cervical seven (N/A)  SURGEON:  Surgeon(s) and Role:    * Julio Sicks, MD - Primary    * Lisbeth Renshaw, MD - Assisting  PHYSICIAN ASSISTANT:   ASSISTANTS:    ANESTHESIA:   general  EBL:  Total I/O In: 1000 [I.V.:1000] Out: -   BLOOD ADMINISTERED:none  DRAINS: none   LOCAL MEDICATIONS USED:  NONE  SPECIMEN:  No Specimen  DISPOSITION OF SPECIMEN:  N/A  COUNTS:  YES  TOURNIQUET:  * No tourniquets in log *  DICTATION: .Dragon Dictation  PLAN OF CARE: Admit to inpatient   PATIENT DISPOSITION:  PACU - hemodynamically stable.   Delay start of Pharmacological VTE agent (>24hrs) due to surgical blood loss or risk of bleeding: yes

## 2017-03-15 NOTE — Anesthesia Postprocedure Evaluation (Signed)
Anesthesia Post Note  Patient: BARUCH LEWERS  Procedure(s) Performed: Procedure(s) (LRB): Anterior Cervical Discectomy Fusion - Cervical five- Cervical six - Cervical six- Cervical seven (N/A)  Patient location during evaluation: PACU Anesthesia Type: General Level of consciousness: awake and alert Pain management: pain level controlled Vital Signs Assessment: post-procedure vital signs reviewed and stable Respiratory status: spontaneous breathing, nonlabored ventilation, respiratory function stable and patient connected to nasal cannula oxygen Cardiovascular status: blood pressure returned to baseline and stable Postop Assessment: no signs of nausea or vomiting Anesthetic complications: no       Last Vitals:  Vitals:   03/15/17 1226 03/15/17 1255  BP:  121/71  Pulse: 84 89  Resp: 11 18  Temp: 36.1 C 36.8 C    Last Pain:  Vitals:   03/15/17 1400  TempSrc:   PainSc: 7                  Kartel Wolbert DAVID

## 2017-03-15 NOTE — Progress Notes (Signed)
Pharmacy Antibiotic Note  Derek Hunter is a 61 y.o. male admitted on 03/15/2017 with surgical prophylaxis.  Pharmacy has been consulted for vancomycin dosing.  Patient without drain. Pre-op vancomycin 1g given at 0824 this morning.  Plan: -vancomycin 1g IV x1 to be given at 200 -pharmacy to sign off as no further doses required  Height:  (185.4 cm) Weight: 193 lb (87.5 kg) IBW/kg (Calculated) : 79.9  Temp (24hrs), Avg:98.2 F (36.8 C), Min:97 F (36.1 C), Max:98.7 F (37.1 C)   Recent Labs Lab 03/08/17 1621 03/15/17 0734  WBC 9.0 11.0*  CREATININE 0.94 0.87    Estimated Creatinine Clearance: 100.8 mL/min (by C-G formula based on SCr of 0.87 mg/dL).    Allergies  Allergen Reactions  . Penicillins Rash    Has patient had a PCN reaction causing immediate rash, facial/tongue/throat swelling, SOB or lightheadedness with hypotension: Yes Has patient had a PCN reaction causing severe rash involving mucus membranes or skin necrosis: No Has patient had a PCN reaction that required hospitalization: No Has patient had a PCN reaction occurring within the last 10 years: No If all of the above answers are "NO", then may proceed with Cephalosporin use.      Thank you for allowing pharmacy to be a part of this patient's care.  Blair Lundeen 03/15/2017 1:17 PM

## 2017-03-15 NOTE — Anesthesia Preprocedure Evaluation (Signed)
Anesthesia Evaluation  Patient identified by MRN, date of birth, ID band Patient awake    Reviewed: Allergy & Precautions, NPO status , Patient's Chart, lab work & pertinent test results  Airway Mallampati: I  TM Distance: >3 FB Neck ROM: Full    Dental   Pulmonary former smoker,    Pulmonary exam normal        Cardiovascular hypertension, Pt. on medications Normal cardiovascular exam     Neuro/Psych Anxiety Depression    GI/Hepatic   Endo/Other  diabetes, Poorly Controlled, Type 2, Oral Hypoglycemic Agents  Renal/GU      Musculoskeletal   Abdominal   Peds  Hematology   Anesthesia Other Findings   Reproductive/Obstetrics                             Anesthesia Physical Anesthesia Plan  ASA: III  Anesthesia Plan: General   Post-op Pain Management:    Induction: Intravenous  Airway Management Planned: Oral ETT  Additional Equipment:   Intra-op Plan:   Post-operative Plan: Extubation in OR  Informed Consent: I have reviewed the patients History and Physical, chart, labs and discussed the procedure including the risks, benefits and alternatives for the proposed anesthesia with the patient or authorized representative who has indicated his/her understanding and acceptance.     Plan Discussed with: CRNA and Surgeon  Anesthesia Plan Comments:         Anesthesia Quick Evaluation

## 2017-03-15 NOTE — Transfer of Care (Signed)
Immediate Anesthesia Transfer of Care Note  Patient: Derek Hunter  Procedure(s) Performed: Procedure(s): Anterior Cervical Discectomy Fusion - Cervical five- Cervical six - Cervical six- Cervical seven (N/A)  Patient Location: PACU  Anesthesia Type:General  Level of Consciousness: awake, alert , oriented and sedated  Airway & Oxygen Therapy: Patient Spontanous Breathing and Patient connected to nasal cannula oxygen  Post-op Assessment: Report given to RN, Post -op Vital signs reviewed and stable and Patient moving all extremities  Post vital signs: Reviewed and stable  Last Vitals:  Vitals:   03/15/17 0653  BP: 133/65  Pulse: 83  Resp: 18  Temp: 37 C    Last Pain:  Vitals:   03/15/17 0730  TempSrc:   PainSc: 9       Patients Stated Pain Goal: 4 (03/15/17 0730)  Complications: No apparent anesthesia complications

## 2017-03-15 NOTE — Progress Notes (Signed)
Inpatient Diabetes Program Recommendations  AACE/ADA: New Consensus Statement on Inpatient Glycemic Control (2015)  Target Ranges:  Prepandial:   less than 140 mg/dL      Peak postprandial:   less than 180 mg/dL (1-2 hours)      Critically ill patients:  140 - 180 mg/dL   Lab Results  Component Value Date   GLUCAP 301 (H) 03/15/2017    Review of Glycemic Control:  Results for LORD, LANCOUR (MRN [id number]) as of 03/15/2017 09:40  Ref. Range 03/15/2017 06:50  Glucose-Capillary Latest Ref Range: 65 - 99 mg/dL 657 (H)    Diabetes history: Type 2 diabetes Outpatient Diabetes medications: Actos 15 mg daily, Metformin 1000 mg bid Current orders for Inpatient glycemic control:  Insulin drip in surgery  Inpatient Diabetes Program Recommendations:   No current A1C available.  Please check A1C.  Also please consider Hospitalist consult during hospitalization for diabetes management and transition off insulin drip.   Thanks, Beryl Meager, RN, BC-ADM Inpatient Diabetes Coordinator Pager [phone] (8a-5p)

## 2017-03-15 NOTE — Progress Notes (Signed)
Orthopedic Tech Progress Note Patient Details:  DARIO YONO 1956-07-19 [id number]  Ortho Devices Type of Ortho Device: Soft collar Ortho Device/Splint Location: Applied Soft collar to pt neck.  Pacu nurse assisted with application.  Pt tolerated application fair.  Ortho Device/Splint Interventions: Application   Alvina Chou 03/15/2017, 11:26 AM

## 2017-03-15 NOTE — Progress Notes (Signed)
  Note that patient now in PACU on insulin drip.  Likely needs basal insulin such as Lantus when coming off insulin drip, however patient may not stay overnight per RN.    Please consider Hospitalist consult due to patient on insulin drip and for improved glycemic control after surgery.  RN states that patients family states patient blood sugars are normally in the 200-300 range at home.  No A1C available.    Based on patient's admission CBG and family reports, patient likely need insulin at home as well.   Consider checking A1C, increase Novolog correction q 4 hours, and if patient is to stay overnight, consider Lantus (0.2 units/kg) which would be approximately 18 units of Lantus daily at least 2 hours prior to d/c of insulin drip.  Again, patient would benefit from medical/hospitalist consult while in the hospital.   Thanks, Beryl Meager, RN, BC-ADM Inpatient Diabetes Coordinator Pager [phone] (8a-5p)

## 2017-03-15 NOTE — H&P (Signed)
Derek Hunter is an 61 y.o. male.   Chief Complaint: Right arm pain HPI: 61 year old male with neck and right upper extremity pain paresthesias and weakness consistent with a mixed cervical radiculopathy. Workup demonstrates evidence of marked cervical spondylosis and stenosis at C5-6 and C6-7 with spinal cord and exiting nerve root compression. Patient presents now for two-level anterior cervical decompression infusion in hopes of improving his symptoms.  Past Medical History:  Diagnosis Date  . Achalasia of esophagus t-5  . Anxiety   . Arthritis   . Cervical stenosis of spinal canal   . Depression   . Diabetes mellitus   . GERD (gastroesophageal reflux disease)   . Hypertension 03/12/2017  . Memory loss     Past Surgical History:  Procedure Laterality Date  . APPENDECTOMY    . DIAGNOSTIC LAPAROSCOPY     " flap to keep esophagus open"  . HERNIA REPAIR      Family History  Problem Relation Age of Onset  . Valvular heart disease Mother    Social History:  reports that he has quit smoking. He has never used smokeless tobacco. He reports that he does not drink alcohol or use drugs.  Allergies:  Allergies  Allergen Reactions  . Penicillins Rash    Has patient had a PCN reaction causing immediate rash, facial/tongue/throat swelling, SOB or lightheadedness with hypotension: Yes Has patient had a PCN reaction causing severe rash involving mucus membranes or skin necrosis: No Has patient had a PCN reaction that required hospitalization: No Has patient had a PCN reaction occurring within the last 10 years: No If all of the above answers are "NO", then may proceed with Cephalosporin use.     Medications Prior to Admission  Medication Sig Dispense Refill  . amitriptyline (ELAVIL) 25 MG tablet Take 25 mg by mouth at bedtime.    . baclofen (LIORESAL) 10 MG tablet Take 10 mg by mouth 2 (two) times daily.    . clonazePAM (KLONOPIN) 0.5 MG tablet Take 0.5 mg by mouth every 6 (six)  hours as needed for anxiety.     . diclofenac sodium (VOLTAREN) 1 % GEL Apply 1 application topically daily as needed for pain.    Marland Kitchen GLIPIZIDE XL 5 MG 24 hr tablet Take 5 mg by mouth 2 (two) times daily.    . metFORMIN (GLUCOPHAGE) 1000 MG tablet Take 1,000 mg by mouth 2 (two) times daily with a meal.    . metoprolol tartrate (LOPRESSOR) 25 MG tablet Take 1 tablet (25 mg total) by mouth 2 (two) times daily. 180 tablet 3  . omeprazole (PRILOSEC) 40 MG capsule Take 40 mg by mouth every evening.    Marland Kitchen PARoxetine (PAXIL) 20 MG tablet Take 30 mg by mouth at bedtime.     . pioglitazone (ACTOS) 15 MG tablet Take 15 mg by mouth daily.    . simvastatin (ZOCOR) 20 MG tablet Take 20 mg by mouth daily.      Results for orders placed or performed during the hospital encounter of 03/15/17 (from the past 48 hour(s))  Glucose, capillary     Status: Abnormal   Collection Time: 03/15/17  6:50 AM  Result Value Ref Range   Glucose-Capillary 301 (H) 65 - 99 mg/dL   No results found.    Blood pressure 133/65, pulse 83, temperature 98.6 F (37 C), temperature source Oral, resp. rate 18, height  (1.854 m), weight 87.5 kg (193 lb), SpO2 98 %. Patient is awake and alert. He  is oriented and appropriate. Cranial nerve function intact. Speech fluent. Judgment and insight intact. Motor examination extremities with some mild weakness of wrist extension on the right otherwise motor strength intact. Sensory examination was decreased sensation over light touch in his right C6 and C7 dermatomes. Reflexes normal and symmetric. No evidence of long track signs. Gait and posture normal. Examination head ears eyes nose and throat is unremarkable. Chest and abdomen are benign. Extremities are free from injury deformity.  Assessment/Plan C5-6, C6-7 spondylosis with stenosis and radiculopathy. Plan C5-6, C6-7 ACDF with cages and plating. Risks and benefits of been explained. Patient wishes to proceed.   Willoughby Doell  A 03/15/2017, 7:50 AM

## 2017-03-16 ENCOUNTER — Encounter (HOSPITAL_COMMUNITY): Payer: Self-pay | Admitting: Neurosurgery

## 2017-03-16 LAB — HEMOGLOBIN A1C
Hgb A1c MFr Bld: 11.6 % — ABNORMAL HIGH (ref 4.8–5.6)
MEAN PLASMA GLUCOSE: 286 mg/dL

## 2017-03-16 LAB — GLUCOSE, CAPILLARY
GLUCOSE-CAPILLARY: 178 mg/dL — AB (ref 65–99)
Glucose-Capillary: 159 mg/dL — ABNORMAL HIGH (ref 65–99)

## 2017-03-16 MED ORDER — CYCLOBENZAPRINE HCL 10 MG PO TABS: 10.0000 mg | ORAL_TABLET | Freq: Three times a day (TID) | ORAL | 0 refills | Status: DC | PRN

## 2017-03-16 MED ORDER — LIVING WELL WITH DIABETES BOOK
Freq: Once | Status: AC
  Filled 2017-03-16: qty 1

## 2017-03-16 MED ORDER — HYDROCODONE-ACETAMINOPHEN 5-325 MG PO TABS: 1.0000 | ORAL_TABLET | ORAL | 0 refills | Status: DC | PRN

## 2017-03-16 MED ORDER — LIVING WELL WITH DIABETES BOOK
Freq: Once | Status: AC
  Administered 2017-03-16: 11:00:00
  Filled 2017-03-16: qty 1

## 2017-03-16 NOTE — Progress Notes (Signed)
Patient is discharged from room 3C03 at this time. Alert and in stable condition. IV site d/c'd and instructions read to patient and family with understanding verbalized. Left unit via wheelchair with all belongings at side. 

## 2017-03-16 NOTE — Progress Notes (Signed)
Inpatient Diabetes Program Recommendations  AACE/ADA: New Consensus Statement on Inpatient Glycemic Control (2015)  Target Ranges:  Prepandial:   less than 140 mg/dL      Peak postprandial:   less than 180 mg/dL (1-2 hours)      Critically ill patients:  140 - 180 mg/dL   Lab Results  Component Value Date   GLUCAP 159 (H) 03/16/2017   HGBA1C 11.6 (H) 03/15/2017    Review of Glycemic ControlResults for TAITEN, BRAWN (MRN [id number]) as of 03/16/2017 13:18  Ref. Range 03/15/2017 19:47 03/15/2017 19:50 03/15/2017 23:58 03/16/2017 04:02 03/16/2017 08:19  Glucose-Capillary Latest Ref Range: 65 - 99 mg/dL 409 (H) 811 (H) 914 (H) 178 (H) 159 (H)   Diabetes history: Type 2 diabetes Outpatient Diabetes medications: Actos 15 mg daily, Metformin 1000 mg bid, Glipizide 10 mg daily Current orders for Inpatient glycemic control:  Lantus 18 units daily and Novolog resistant q 4 hours  Inpatient Diabetes Program Recommendations:    Spoke with patient's family regarding elevated blood sugars.  Family states that patient has meter but rarely monitors.  However when he does check blood sugar they are in the 200's.  Patient has seen his PCP in the past week and states that A1C was in the 10% range and PCP did not make any changes.  Daughter states that this is likely due to patient not being compliant?  Discussed regimen that patient has been on in the hospital.  Wife and daughter asked me to write it down for them to take to PCP.  Discussed why it is important to have diabetes controlled and the way that basal insulin works.  Family states that patient has been under lots of stress and anxiety lately which has led to him feeling down and sometimes with memory problems.  They state that he likes to eat lots of sweets as well.  Ordered Living well with diabetes booklet for patient and family and encouraged follow-up.  Also encouraged monitoring at least twice a day for the next week and if blood sugars are >200  mg/dL, to report to PCP.  They verbalized understanding.   Thanks, Beryl Meager, RN, BC-ADM Inpatient Diabetes Coordinator Pager [phone] (8a-5p)

## 2017-03-16 NOTE — Discharge Instructions (Signed)

## 2017-03-16 NOTE — Discharge Summary (Signed)
Physician Discharge Summary  Patient ID: Derek Hunter MRN: [id number] DOB/AGE: October 12, 1956 61 y.o.  Admit date: 03/15/2017 Discharge date: 03/16/2017  Admission Diagnoses:  Discharge Diagnoses:  Active Problems:   Cervical stenosis of spinal canal   Discharged Condition: good  Hospital Course: Patient is admitted to the hospital oriented when uncomplicated two-level anterior cervical decompression and fusion. Postoperatively doing reasonably well. Neck and upper extremity pain are improved. Ambulate without difficulty. Ready for discharge home.  Consults:   Significant Diagnostic Studies:   Treatments:   Discharge Exam: Blood pressure 99/66, pulse 75, temperature 98.6 F (37 C), temperature source Oral, resp. rate 18, height  (1.854 m), weight 87.5 kg (193 lb), SpO2 98 %. Awake and alert. Oriented and appropriate. Cranial nerve function intact. Motor and sensory function extremities normal. Wound clean and dry. Chest and abdomen benign.  Disposition: 01-Home or Self Care   Allergies as of 03/16/2017      Reactions   Penicillins Rash   Has patient had a PCN reaction causing immediate rash, facial/tongue/throat swelling, SOB or lightheadedness with hypotension: Yes Has patient had a PCN reaction causing severe rash involving mucus membranes or skin necrosis: No Has patient had a PCN reaction that required hospitalization: No Has patient had a PCN reaction occurring within the last 10 years: No If all of the above answers are "NO", then may proceed with Cephalosporin use.      Medication List    TAKE these medications   amitriptyline 25 MG tablet Commonly known as:  ELAVIL Take 25 mg by mouth at bedtime.   baclofen 10 MG tablet Commonly known as:  LIORESAL Take 10 mg by mouth 2 (two) times daily.   clonazePAM 0.5 MG tablet Commonly known as:  KLONOPIN Take 0.5 mg by mouth every 6 (six) hours as needed for anxiety.   cyclobenzaprine 10 MG tablet Commonly known  as:  FLEXERIL Take 1 tablet (10 mg total) by mouth 3 (three) times daily as needed for muscle spasms.   diclofenac sodium 1 % Gel Commonly known as:  VOLTAREN Apply 1 application topically daily as needed for pain.   GLIPIZIDE XL 5 MG 24 hr tablet Generic drug:  glipiZIDE Take 5 mg by mouth 2 (two) times daily.   HYDROcodone-acetaminophen 5-325 MG tablet Commonly known as:  NORCO/VICODIN Take 1-2 tablets by mouth every 4 (four) hours as needed (breakthrough pain).   metFORMIN 1000 MG tablet Commonly known as:  GLUCOPHAGE Take 1,000 mg by mouth 2 (two) times daily with a meal.   metoprolol tartrate 25 MG tablet Commonly known as:  LOPRESSOR Take 1 tablet (25 mg total) by mouth 2 (two) times daily.   omeprazole 40 MG capsule Commonly known as:  PRILOSEC Take 40 mg by mouth every evening.   PARoxetine 20 MG tablet Commonly known as:  PAXIL Take 30 mg by mouth at bedtime.   pioglitazone 15 MG tablet Commonly known as:  ACTOS Take 15 mg by mouth daily.   simvastatin 20 MG tablet Commonly known as:  ZOCOR Take 20 mg by mouth daily.        Signed: Antwanette Wesche A 03/16/2017, 10:02 AM

## 2017-04-06 ENCOUNTER — Encounter: Payer: Self-pay | Admitting: Cardiovascular Disease

## 2017-04-06 ENCOUNTER — Ambulatory Visit (INDEPENDENT_AMBULATORY_CARE_PROVIDER_SITE_OTHER): Payer: Medicare Other | Admitting: Cardiovascular Disease

## 2017-04-06 VITALS — BP 151/85 | HR 116 | Ht 72.0 in | Wt 198.0 lb

## 2017-04-06 DIAGNOSIS — E782 Mixed hyperlipidemia: Secondary | ICD-10-CM | POA: Diagnosis not present

## 2017-04-06 DIAGNOSIS — R000 Tachycardia, unspecified: Secondary | ICD-10-CM | POA: Diagnosis not present

## 2017-04-06 DIAGNOSIS — I10 Essential (primary) hypertension: Secondary | ICD-10-CM | POA: Diagnosis not present

## 2017-04-06 NOTE — Progress Notes (Signed)
SUBJECTIVE: The patient presents for follow-up of inappropriate sinus tachycardia. This is my first time meeting him. He was evaluated by Dr. Eldridge DaceVaranasi on 03/13/17. He also has hyperlipidemia and is now on simvastatin. He recently underwent anterior cervical fusion of C5-C7.  He is here with his daughter whom I have evaluated before.   He has been feeling tired and fatigued. He is anxious and also has neck pain.  He does not say much and his daughter provides a lot of the history.  Apparently he has a history of anxiety. He did say he is short of breath when climbing 11 steps. He denies exertional chest pain. He was prescribed metoprolol 25 mg twice daily but has been taking 12.5 mg twice daily.  He was recent started on insulin. HbA1c markedly elevated at 11.6% on 03/15/17. On the same day, white blood cells 11, hemoglobin 13.3, BUN 10, creatinine 1. TSH normal at 1.5 on 4/12.  He has memory problems.  I personally interpreted the ECG performed on 03/13/17 which showed sinus tachycardia, 157 bpm.      Review of Systems: As per "subjective", otherwise negative.  Allergies  Allergen Reactions  . Penicillins Rash    Has patient had a PCN reaction causing immediate rash, facial/tongue/throat swelling, SOB or lightheadedness with hypotension: Yes Has patient had a PCN reaction causing severe rash involving mucus membranes or skin necrosis: No Has patient had a PCN reaction that required hospitalization: No Has patient had a PCN reaction occurring within the last 10 years: No If all of the above answers are "NO", then may proceed with Cephalosporin use.     Current Outpatient Prescriptions  Medication Sig Dispense Refill  . amitriptyline (ELAVIL) 25 MG tablet Take 25 mg by mouth at bedtime.    . baclofen (LIORESAL) 10 MG tablet Take 10 mg by mouth 2 (two) times daily.    . clonazePAM (KLONOPIN) 0.5 MG tablet Take 0.5 mg by mouth every 6 (six) hours as needed for anxiety.     .  cyclobenzaprine (FLEXERIL) 10 MG tablet Take 1 tablet (10 mg total) by mouth 3 (three) times daily as needed for muscle spasms. 30 tablet 0  . diclofenac sodium (VOLTAREN) 1 % GEL Apply 1 application topically daily as needed for pain.    Marland Kitchen. GLIPIZIDE XL 5 MG 24 hr tablet Take 5 mg by mouth 2 (two) times daily.    Marland Kitchen. HYDROcodone-acetaminophen (NORCO/VICODIN) 5-325 MG tablet Take 1-2 tablets by mouth every 4 (four) hours as needed (breakthrough pain). 60 tablet 0  . metFORMIN (GLUCOPHAGE) 1000 MG tablet Take 1,000 mg by mouth 2 (two) times daily with a meal.    . metoprolol tartrate (LOPRESSOR) 25 MG tablet Take 1 tablet (25 mg total) by mouth 2 (two) times daily. 180 tablet 3  . omeprazole (PRILOSEC) 40 MG capsule Take 40 mg by mouth every evening.    Marland Kitchen. PARoxetine (PAXIL) 20 MG tablet Take 30 mg by mouth at bedtime.     . pioglitazone (ACTOS) 15 MG tablet Take 15 mg by mouth daily.    . simvastatin (ZOCOR) 20 MG tablet Take 20 mg by mouth daily.     No current facility-administered medications for this visit.     Past Medical History:  Diagnosis Date  . Achalasia of esophagus t-5  . Anxiety   . Arthritis   . Cervical stenosis of spinal canal   . Depression   . Diabetes mellitus   . GERD (gastroesophageal  reflux disease)   . Hypertension 03/12/2017  . Memory loss     Past Surgical History:  Procedure Laterality Date  . ANTERIOR CERVICAL DECOMP/DISCECTOMY FUSION N/A 03/15/2017   Procedure: Anterior Cervical Discectomy Fusion - Cervical five- Cervical six - Cervical six- Cervical seven;  Surgeon: Julio Sicks, MD;  Location: [facility] OR;  Service: Neurosurgery;  Laterality: N/A;  . APPENDECTOMY    . DIAGNOSTIC LAPAROSCOPY     " flap to keep esophagus open"  . HERNIA REPAIR      Social History   Social History  . Marital status: Legally Separated    Spouse name: N/A  . Number of children: N/A  . Years of education: N/A   Occupational History  . Not on file.   Social History Main  Topics  . Smoking status: Former Games developer  . Smokeless tobacco: Never Used  . Alcohol use No  . Drug use: No  . Sexual activity: Not on file   Other Topics Concern  . Not on file   Social History Narrative  . No narrative on file     Vitals:   04/06/17 1601  BP: (!) 151/85  Pulse: (!) 116  SpO2: 97%  Weight: 198 lb (89.8 kg)  Height: 6' (1.829 m)    Wt Readings from Last 3 Encounters:  04/06/17 198 lb (89.8 kg)  03/15/17 193 lb (87.5 kg)  03/13/17 193 lb (87.5 kg)     PHYSICAL EXAM General: NAD HEENT: Normal. Neck: No JVD, no thyromegaly. Lungs: Clear to auscultation bilaterally with normal respiratory effort. CV: Nondisplaced PMI.  Tachycardic, regular rhythm, normal S1/S2, no S3/S4, no murmur. No pretibial or periankle edema.  No carotid bruit.   Abdomen: Soft, nontender, no distention.  Neurologic: Alert and oriented.  Psych: Flat affect. Skin: Normal. Musculoskeletal: No gross deformities.    ECG: Most recent ECG reviewed.   Labs: Lab Results  Component Value Date/Time   K 4.0 03/15/2017 07:34 AM   BUN 13 03/15/2017 07:34 AM   CREATININE 0.87 03/15/2017 07:34 AM   ALT 22 04/26/2011 08:13 AM   TSH 1.514 03/08/2017 04:45 PM   HGB 13.3 03/15/2017 07:34 AM     Lipids: No results found for: LDLCALC, LDLDIRECT, CHOL, TRIG, HDL     ASSESSMENT AND PLAN: 1. Inappropriate sinus tachycardia: Heart rate remains elevated. However, his daughter says heart rates and blood pressure have been controlled at home. He has been taking 12.5 mg twice daily. I asked them to monitor blood pressure and heart rate over the next several weeks and to provide me a copy of the log to determine if the metoprolol needs to be adjusted. I suspect it will need to be increased to 25 mg twice daily if not more so. There is no evidence for hyperthyroidism or anemia nor infection to suggest a physiologic cause. Today he is anxious and in pain which can lead to elevated heart rates.  2.  Hypertension: Elevated. He has diabetes. His daughter says blood pressures are usually normal. I have asked them to maintain a blood pressure log over the next several weeks. Creatinine 0.87 on 03/15/17. If it remains elevated, for nephro protective effects, I would start losartan 25 mg daily.  3. Hyperlipidemia: Continue simvastatin.    Disposition: Follow up 3 months.  Time spent: 40 minutes, of which greater than 50% was spent reviewing symptoms, relevant blood tests and studies, and discussing management plan with the patient.   Prentice Docker, M.D., F.A.C.C.

## 2017-04-06 NOTE — Patient Instructions (Signed)
Your physician recommends that you schedule a follow-up appointment in: 3 MONTHS WITH DR Purvis SheffieldKONESWARAN  Your physician recommends that you continue on your current medications as directed. Please refer to the Current Medication list given to you today.  MONITOR YOUR HEART RATE AND BLOOD PRESSURE - WE HAVE GIVEN YOU A LOG   Thank you for choosing [facility]!!

## 2017-04-25 DIAGNOSIS — M5412 Radiculopathy, cervical region: Secondary | ICD-10-CM | POA: Diagnosis not present

## 2017-05-23 DIAGNOSIS — Z6827 Body mass index (BMI) 27.0-27.9, adult: Secondary | ICD-10-CM | POA: Diagnosis not present

## 2017-05-23 DIAGNOSIS — M5412 Radiculopathy, cervical region: Secondary | ICD-10-CM | POA: Diagnosis not present

## 2017-05-23 DIAGNOSIS — I10 Essential (primary) hypertension: Secondary | ICD-10-CM | POA: Diagnosis not present

## 2017-06-04 DIAGNOSIS — E7800 Pure hypercholesterolemia, unspecified: Secondary | ICD-10-CM | POA: Diagnosis not present

## 2017-06-04 DIAGNOSIS — R51 Headache: Secondary | ICD-10-CM | POA: Diagnosis not present

## 2017-06-04 DIAGNOSIS — Z7984 Long term (current) use of oral hypoglycemic drugs: Secondary | ICD-10-CM | POA: Diagnosis not present

## 2017-06-04 DIAGNOSIS — Z87891 Personal history of nicotine dependence: Secondary | ICD-10-CM | POA: Diagnosis not present

## 2017-06-04 DIAGNOSIS — F419 Anxiety disorder, unspecified: Secondary | ICD-10-CM | POA: Diagnosis not present

## 2017-06-04 DIAGNOSIS — Z9889 Other specified postprocedural states: Secondary | ICD-10-CM | POA: Diagnosis not present

## 2017-06-04 DIAGNOSIS — F329 Major depressive disorder, single episode, unspecified: Secondary | ICD-10-CM | POA: Diagnosis not present

## 2017-06-04 DIAGNOSIS — G4489 Other headache syndrome: Secondary | ICD-10-CM | POA: Diagnosis not present

## 2017-06-04 DIAGNOSIS — E119 Type 2 diabetes mellitus without complications: Secondary | ICD-10-CM | POA: Diagnosis not present

## 2017-06-04 DIAGNOSIS — Z79899 Other long term (current) drug therapy: Secondary | ICD-10-CM | POA: Diagnosis not present

## 2017-06-04 DIAGNOSIS — M542 Cervicalgia: Secondary | ICD-10-CM | POA: Diagnosis not present

## 2017-06-04 DIAGNOSIS — F0390 Unspecified dementia without behavioral disturbance: Secondary | ICD-10-CM | POA: Diagnosis not present

## 2017-06-04 DIAGNOSIS — R000 Tachycardia, unspecified: Secondary | ICD-10-CM | POA: Diagnosis not present

## 2017-07-04 DIAGNOSIS — Z6827 Body mass index (BMI) 27.0-27.9, adult: Secondary | ICD-10-CM | POA: Diagnosis not present

## 2017-07-04 DIAGNOSIS — M5412 Radiculopathy, cervical region: Secondary | ICD-10-CM | POA: Diagnosis not present

## 2017-07-04 DIAGNOSIS — I10 Essential (primary) hypertension: Secondary | ICD-10-CM | POA: Diagnosis not present

## 2017-07-05 DIAGNOSIS — B029 Zoster without complications: Secondary | ICD-10-CM | POA: Diagnosis not present

## 2017-07-16 DIAGNOSIS — Z6826 Body mass index (BMI) 26.0-26.9, adult: Secondary | ICD-10-CM | POA: Diagnosis not present

## 2017-07-16 DIAGNOSIS — E782 Mixed hyperlipidemia: Secondary | ICD-10-CM | POA: Diagnosis not present

## 2017-07-16 DIAGNOSIS — M542 Cervicalgia: Secondary | ICD-10-CM | POA: Diagnosis not present

## 2017-07-16 DIAGNOSIS — F324 Major depressive disorder, single episode, in partial remission: Secondary | ICD-10-CM | POA: Diagnosis not present

## 2017-07-16 DIAGNOSIS — F064 Anxiety disorder due to known physiological condition: Secondary | ICD-10-CM | POA: Diagnosis not present

## 2017-07-16 DIAGNOSIS — E1165 Type 2 diabetes mellitus with hyperglycemia: Secondary | ICD-10-CM | POA: Diagnosis not present

## 2017-07-16 DIAGNOSIS — K210 Gastro-esophageal reflux disease with esophagitis: Secondary | ICD-10-CM | POA: Diagnosis not present

## 2017-07-16 DIAGNOSIS — G3184 Mild cognitive impairment, so stated: Secondary | ICD-10-CM | POA: Diagnosis not present

## 2017-07-18 ENCOUNTER — Ambulatory Visit: Payer: Medicare Other | Admitting: Cardiovascular Disease

## 2017-07-20 DIAGNOSIS — R030 Elevated blood-pressure reading, without diagnosis of hypertension: Secondary | ICD-10-CM | POA: Diagnosis not present

## 2017-07-20 DIAGNOSIS — Z6826 Body mass index (BMI) 26.0-26.9, adult: Secondary | ICD-10-CM | POA: Diagnosis not present

## 2017-07-20 DIAGNOSIS — M542 Cervicalgia: Secondary | ICD-10-CM | POA: Diagnosis not present

## 2017-07-20 DIAGNOSIS — G5603 Carpal tunnel syndrome, bilateral upper limbs: Secondary | ICD-10-CM | POA: Diagnosis not present

## 2017-07-23 ENCOUNTER — Encounter: Payer: Self-pay | Admitting: Cardiovascular Disease

## 2017-07-23 ENCOUNTER — Ambulatory Visit (INDEPENDENT_AMBULATORY_CARE_PROVIDER_SITE_OTHER): Payer: Medicare Other | Admitting: Cardiovascular Disease

## 2017-07-23 VITALS — BP 136/80 | HR 71 | Ht 72.0 in | Wt 201.2 lb

## 2017-07-23 DIAGNOSIS — I10 Essential (primary) hypertension: Secondary | ICD-10-CM | POA: Diagnosis not present

## 2017-07-23 DIAGNOSIS — E782 Mixed hyperlipidemia: Secondary | ICD-10-CM | POA: Diagnosis not present

## 2017-07-23 DIAGNOSIS — R000 Tachycardia, unspecified: Secondary | ICD-10-CM

## 2017-07-23 NOTE — Progress Notes (Signed)
SUBJECTIVE: The patient presents for follow-up of inappropriate sinus tachycardia.  The patient denies any symptoms of chest pain, palpitations, shortness of breath, lightheadedness, dizziness, leg swelling, orthopnea, PND, and syncope.  He checks his heart rate at home and it is usually in the 70 bpm range.   Review of Systems: As per "subjective", otherwise negative.  Allergies  Allergen Reactions  . Penicillins Rash    Has patient had a PCN reaction causing immediate rash, facial/tongue/throat swelling, SOB or lightheadedness with hypotension: Yes Has patient had a PCN reaction causing severe rash involving mucus membranes or skin necrosis: No Has patient had a PCN reaction that required hospitalization: No Has patient had a PCN reaction occurring within the last 10 years: No If all of the above answers are "NO", then may proceed with Cephalosporin use.     Current Outpatient Prescriptions  Medication Sig Dispense Refill  . amitriptyline (ELAVIL) 25 MG tablet Take 25 mg by mouth at bedtime.    . baclofen (LIORESAL) 10 MG tablet Take 10 mg by mouth 2 (two) times daily.    . cyclobenzaprine (FLEXERIL) 10 MG tablet Take 1 tablet (10 mg total) by mouth 3 (three) times daily as needed for muscle spasms. 30 tablet 0  . diclofenac sodium (VOLTAREN) 1 % GEL Apply 1 application topically daily as needed for pain.    Marland Kitchen GLIPIZIDE XL 5 MG 24 hr tablet Take 5 mg by mouth 2 (two) times daily.    . metFORMIN (GLUCOPHAGE) 1000 MG tablet Take 1,000 mg by mouth 2 (two) times daily with a meal.    . metoprolol tartrate (LOPRESSOR) 25 MG tablet Take 12.5 mg by mouth 2 (two) times daily.    Marland Kitchen omeprazole (PRILOSEC) 40 MG capsule Take 40 mg by mouth every evening.    Marland Kitchen PARoxetine (PAXIL) 20 MG tablet Take 30 mg by mouth at bedtime.     . pioglitazone (ACTOS) 15 MG tablet Take 15 mg by mouth daily.    . simvastatin (ZOCOR) 20 MG tablet Take 20 mg by mouth daily.     No current  facility-administered medications for this visit.     Past Medical History:  Diagnosis Date  . Achalasia of esophagus t-5  . Anxiety   . Arthritis   . Cervical stenosis of spinal canal   . Depression   . Diabetes mellitus   . GERD (gastroesophageal reflux disease)   . Hypertension 03/12/2017  . Memory loss     Past Surgical History:  Procedure Laterality Date  . ANTERIOR CERVICAL DECOMP/DISCECTOMY FUSION N/A 03/15/2017   Procedure: Anterior Cervical Discectomy Fusion - Cervical five- Cervical six - Cervical six- Cervical seven;  Surgeon: Julio Sicks, MD;  [facility];  Service: Neurosurgery;  Laterality: N/A;  . APPENDECTOMY    . DIAGNOSTIC LAPAROSCOPY     " flap to keep esophagus open"  . HERNIA REPAIR      Social History   Social History  . Marital status: Legally Separated    Spouse name: N/A  . Number of children: N/A  . Years of education: N/A   Occupational History  . Not on file.   Social History Main Topics  . Smoking status: Former Smoker    Quit date: 11/27/1986  . Smokeless tobacco: Never Used  . Alcohol use No  . Drug use: No  . Sexual activity: Not on file   Other Topics Concern  . Not on file   Social History Narrative  .  No narrative on file     Vitals:   07/23/17 1036  BP: 136/80  Pulse: 71  SpO2: 95%  Weight: 201 lb 3.2 oz (91.3 kg)  Height: 6' (1.829 m)    Wt Readings from Last 3 Encounters:  07/23/17 201 lb 3.2 oz (91.3 kg)  04/06/17 198 lb (89.8 kg)  03/15/17 193 lb (87.5 kg)     PHYSICAL EXAM General: NAD HEENT: Normal. Neck: No JVD, no thyromegaly. Lungs: Clear to auscultation bilaterally with normal respiratory effort. CV: Nondisplaced PMI.  Regular rate and rhythm, normal S1/S2, no S3/S4, no murmur. No pretibial or periankle edema.  No carotid bruit.   Abdomen: Soft, nontender, no distention.  Neurologic: Alert and oriented.  Psych: Normal affect. Skin: Normal. Musculoskeletal: No gross deformities.    ECG: Most  recent ECG reviewed.   Labs: Lab Results  Component Value Date/Time   K 4.0 03/15/2017 07:34 AM   BUN 13 03/15/2017 07:34 AM   CREATININE 0.87 03/15/2017 07:34 AM   ALT 22 04/26/2011 08:13 AM   TSH 1.514 03/08/2017 04:45 PM   HGB 13.3 03/15/2017 07:34 AM     Lipids: No results found for: LDLCALC, LDLDIRECT, CHOL, TRIG, HDL     ASSESSMENT AND PLAN:   1. Inappropriate sinus tachycardia: Symptomatically stable. Heart rate controlled on metoprolol 12.5 mg twice daily. No changes.  2. Hypertension: Mildly elevated. He has diabetes. If it is elevated in the future, for nephro protective effects, I would start losartan 25 mg daily.  3. Hyperlipidemia: Continue simvastatin.    Disposition: Follow up 1 yr  Prentice Docker, M.D., F.A.C.C.

## 2017-07-23 NOTE — Patient Instructions (Signed)

## 2017-10-30 DIAGNOSIS — J019 Acute sinusitis, unspecified: Secondary | ICD-10-CM | POA: Diagnosis not present

## 2017-10-30 DIAGNOSIS — Z6828 Body mass index (BMI) 28.0-28.9, adult: Secondary | ICD-10-CM | POA: Diagnosis not present

## 2017-11-12 DIAGNOSIS — I10 Essential (primary) hypertension: Secondary | ICD-10-CM | POA: Diagnosis not present

## 2017-11-12 DIAGNOSIS — F064 Anxiety disorder due to known physiological condition: Secondary | ICD-10-CM | POA: Diagnosis not present

## 2017-11-12 DIAGNOSIS — E782 Mixed hyperlipidemia: Secondary | ICD-10-CM | POA: Diagnosis not present

## 2017-11-12 DIAGNOSIS — K210 Gastro-esophageal reflux disease with esophagitis: Secondary | ICD-10-CM | POA: Diagnosis not present

## 2017-11-12 DIAGNOSIS — E1165 Type 2 diabetes mellitus with hyperglycemia: Secondary | ICD-10-CM | POA: Diagnosis not present

## 2017-11-12 DIAGNOSIS — F324 Major depressive disorder, single episode, in partial remission: Secondary | ICD-10-CM | POA: Diagnosis not present

## 2017-11-15 DIAGNOSIS — M542 Cervicalgia: Secondary | ICD-10-CM | POA: Diagnosis not present

## 2017-11-15 DIAGNOSIS — Z6826 Body mass index (BMI) 26.0-26.9, adult: Secondary | ICD-10-CM | POA: Diagnosis not present

## 2017-11-15 DIAGNOSIS — G3184 Mild cognitive impairment, so stated: Secondary | ICD-10-CM | POA: Diagnosis not present

## 2017-11-15 DIAGNOSIS — M25512 Pain in left shoulder: Secondary | ICD-10-CM | POA: Diagnosis not present

## 2017-11-15 DIAGNOSIS — K210 Gastro-esophageal reflux disease with esophagitis: Secondary | ICD-10-CM | POA: Diagnosis not present

## 2017-11-15 DIAGNOSIS — E1165 Type 2 diabetes mellitus with hyperglycemia: Secondary | ICD-10-CM | POA: Diagnosis not present

## 2017-11-15 DIAGNOSIS — Z23 Encounter for immunization: Secondary | ICD-10-CM | POA: Diagnosis not present

## 2017-11-15 DIAGNOSIS — E782 Mixed hyperlipidemia: Secondary | ICD-10-CM | POA: Diagnosis not present

## 2018-01-23 DIAGNOSIS — M50123 Cervical disc disorder at C6-C7 level with radiculopathy: Secondary | ICD-10-CM | POA: Diagnosis not present

## 2018-01-23 DIAGNOSIS — G8929 Other chronic pain: Secondary | ICD-10-CM | POA: Diagnosis not present

## 2018-01-23 DIAGNOSIS — M961 Postlaminectomy syndrome, not elsewhere classified: Secondary | ICD-10-CM | POA: Diagnosis not present

## 2018-01-23 DIAGNOSIS — M542 Cervicalgia: Secondary | ICD-10-CM | POA: Diagnosis not present

## 2018-01-23 DIAGNOSIS — G629 Polyneuropathy, unspecified: Secondary | ICD-10-CM | POA: Diagnosis not present

## 2018-01-23 DIAGNOSIS — M791 Myalgia, unspecified site: Secondary | ICD-10-CM | POA: Diagnosis not present

## 2018-01-23 DIAGNOSIS — M5003 Cervical disc disorder with myelopathy, cervicothoracic region: Secondary | ICD-10-CM | POA: Diagnosis not present

## 2018-01-27 DIAGNOSIS — M25512 Pain in left shoulder: Secondary | ICD-10-CM | POA: Diagnosis not present

## 2018-02-25 DIAGNOSIS — M542 Cervicalgia: Secondary | ICD-10-CM | POA: Diagnosis not present

## 2018-02-25 DIAGNOSIS — G8929 Other chronic pain: Secondary | ICD-10-CM | POA: Diagnosis not present

## 2018-02-25 DIAGNOSIS — M5003 Cervical disc disorder with myelopathy, cervicothoracic region: Secondary | ICD-10-CM | POA: Diagnosis not present

## 2018-02-25 DIAGNOSIS — M791 Myalgia, unspecified site: Secondary | ICD-10-CM | POA: Diagnosis not present

## 2018-02-25 DIAGNOSIS — M25512 Pain in left shoulder: Secondary | ICD-10-CM | POA: Diagnosis not present

## 2018-02-25 DIAGNOSIS — G894 Chronic pain syndrome: Secondary | ICD-10-CM | POA: Diagnosis not present

## 2018-02-25 DIAGNOSIS — M50123 Cervical disc disorder at C6-C7 level with radiculopathy: Secondary | ICD-10-CM | POA: Diagnosis not present

## 2018-02-25 DIAGNOSIS — G629 Polyneuropathy, unspecified: Secondary | ICD-10-CM | POA: Diagnosis not present

## 2018-02-25 DIAGNOSIS — M961 Postlaminectomy syndrome, not elsewhere classified: Secondary | ICD-10-CM | POA: Diagnosis not present

## 2018-03-01 DIAGNOSIS — J029 Acute pharyngitis, unspecified: Secondary | ICD-10-CM | POA: Diagnosis not present

## 2018-03-14 ENCOUNTER — Other Ambulatory Visit: Payer: Self-pay | Admitting: Anesthesiology

## 2018-03-14 DIAGNOSIS — M25512 Pain in left shoulder: Secondary | ICD-10-CM

## 2018-03-14 DIAGNOSIS — M5003 Cervical disc disorder with myelopathy, cervicothoracic region: Secondary | ICD-10-CM

## 2018-03-20 DIAGNOSIS — E1165 Type 2 diabetes mellitus with hyperglycemia: Secondary | ICD-10-CM | POA: Diagnosis not present

## 2018-03-20 DIAGNOSIS — K210 Gastro-esophageal reflux disease with esophagitis: Secondary | ICD-10-CM | POA: Diagnosis not present

## 2018-03-20 DIAGNOSIS — I10 Essential (primary) hypertension: Secondary | ICD-10-CM | POA: Diagnosis not present

## 2018-03-20 DIAGNOSIS — E782 Mixed hyperlipidemia: Secondary | ICD-10-CM | POA: Diagnosis not present

## 2018-03-22 ENCOUNTER — Ambulatory Visit: Payer: Medicare Other

## 2018-03-25 DIAGNOSIS — M542 Cervicalgia: Secondary | ICD-10-CM | POA: Diagnosis not present

## 2018-03-25 DIAGNOSIS — M791 Myalgia, unspecified site: Secondary | ICD-10-CM | POA: Diagnosis not present

## 2018-03-25 DIAGNOSIS — M5003 Cervical disc disorder with myelopathy, cervicothoracic region: Secondary | ICD-10-CM | POA: Diagnosis not present

## 2018-03-25 DIAGNOSIS — G894 Chronic pain syndrome: Secondary | ICD-10-CM | POA: Diagnosis not present

## 2018-03-25 DIAGNOSIS — G8929 Other chronic pain: Secondary | ICD-10-CM | POA: Diagnosis not present

## 2018-03-25 DIAGNOSIS — G629 Polyneuropathy, unspecified: Secondary | ICD-10-CM | POA: Diagnosis not present

## 2018-03-25 DIAGNOSIS — M961 Postlaminectomy syndrome, not elsewhere classified: Secondary | ICD-10-CM | POA: Diagnosis not present

## 2018-03-25 DIAGNOSIS — M50123 Cervical disc disorder at C6-C7 level with radiculopathy: Secondary | ICD-10-CM | POA: Diagnosis not present

## 2018-03-26 DIAGNOSIS — M542 Cervicalgia: Secondary | ICD-10-CM | POA: Diagnosis not present

## 2018-03-26 DIAGNOSIS — M25512 Pain in left shoulder: Secondary | ICD-10-CM | POA: Diagnosis not present

## 2018-03-26 DIAGNOSIS — G3184 Mild cognitive impairment, so stated: Secondary | ICD-10-CM | POA: Diagnosis not present

## 2018-03-26 DIAGNOSIS — F064 Anxiety disorder due to known physiological condition: Secondary | ICD-10-CM | POA: Diagnosis not present

## 2018-03-26 DIAGNOSIS — E1165 Type 2 diabetes mellitus with hyperglycemia: Secondary | ICD-10-CM | POA: Diagnosis not present

## 2018-03-26 DIAGNOSIS — E782 Mixed hyperlipidemia: Secondary | ICD-10-CM | POA: Diagnosis not present

## 2018-03-26 DIAGNOSIS — K210 Gastro-esophageal reflux disease with esophagitis: Secondary | ICD-10-CM | POA: Diagnosis not present

## 2018-04-17 DIAGNOSIS — M542 Cervicalgia: Secondary | ICD-10-CM | POA: Diagnosis not present

## 2018-04-17 DIAGNOSIS — M47812 Spondylosis without myelopathy or radiculopathy, cervical region: Secondary | ICD-10-CM | POA: Diagnosis not present

## 2018-04-17 DIAGNOSIS — M5003 Cervical disc disorder with myelopathy, cervicothoracic region: Secondary | ICD-10-CM | POA: Diagnosis not present

## 2018-04-17 DIAGNOSIS — M4802 Spinal stenosis, cervical region: Secondary | ICD-10-CM | POA: Diagnosis not present

## 2018-04-17 DIAGNOSIS — M4313 Spondylolisthesis, cervicothoracic region: Secondary | ICD-10-CM | POA: Diagnosis not present

## 2018-04-17 DIAGNOSIS — Z981 Arthrodesis status: Secondary | ICD-10-CM | POA: Diagnosis not present

## 2018-04-18 DIAGNOSIS — E119 Type 2 diabetes mellitus without complications: Secondary | ICD-10-CM | POA: Diagnosis not present

## 2018-04-18 DIAGNOSIS — H25813 Combined forms of age-related cataract, bilateral: Secondary | ICD-10-CM | POA: Diagnosis not present

## 2018-04-22 IMAGING — DX DG CHEST 1V PORT
2 series · 2 of 2 positions shown · non-contrast
Comparison: None

CLINICAL DATA: Sinus tachycardia.

EXAM:
PORTABLE CHEST 1 VIEW

[chest ap (1 of 2)]
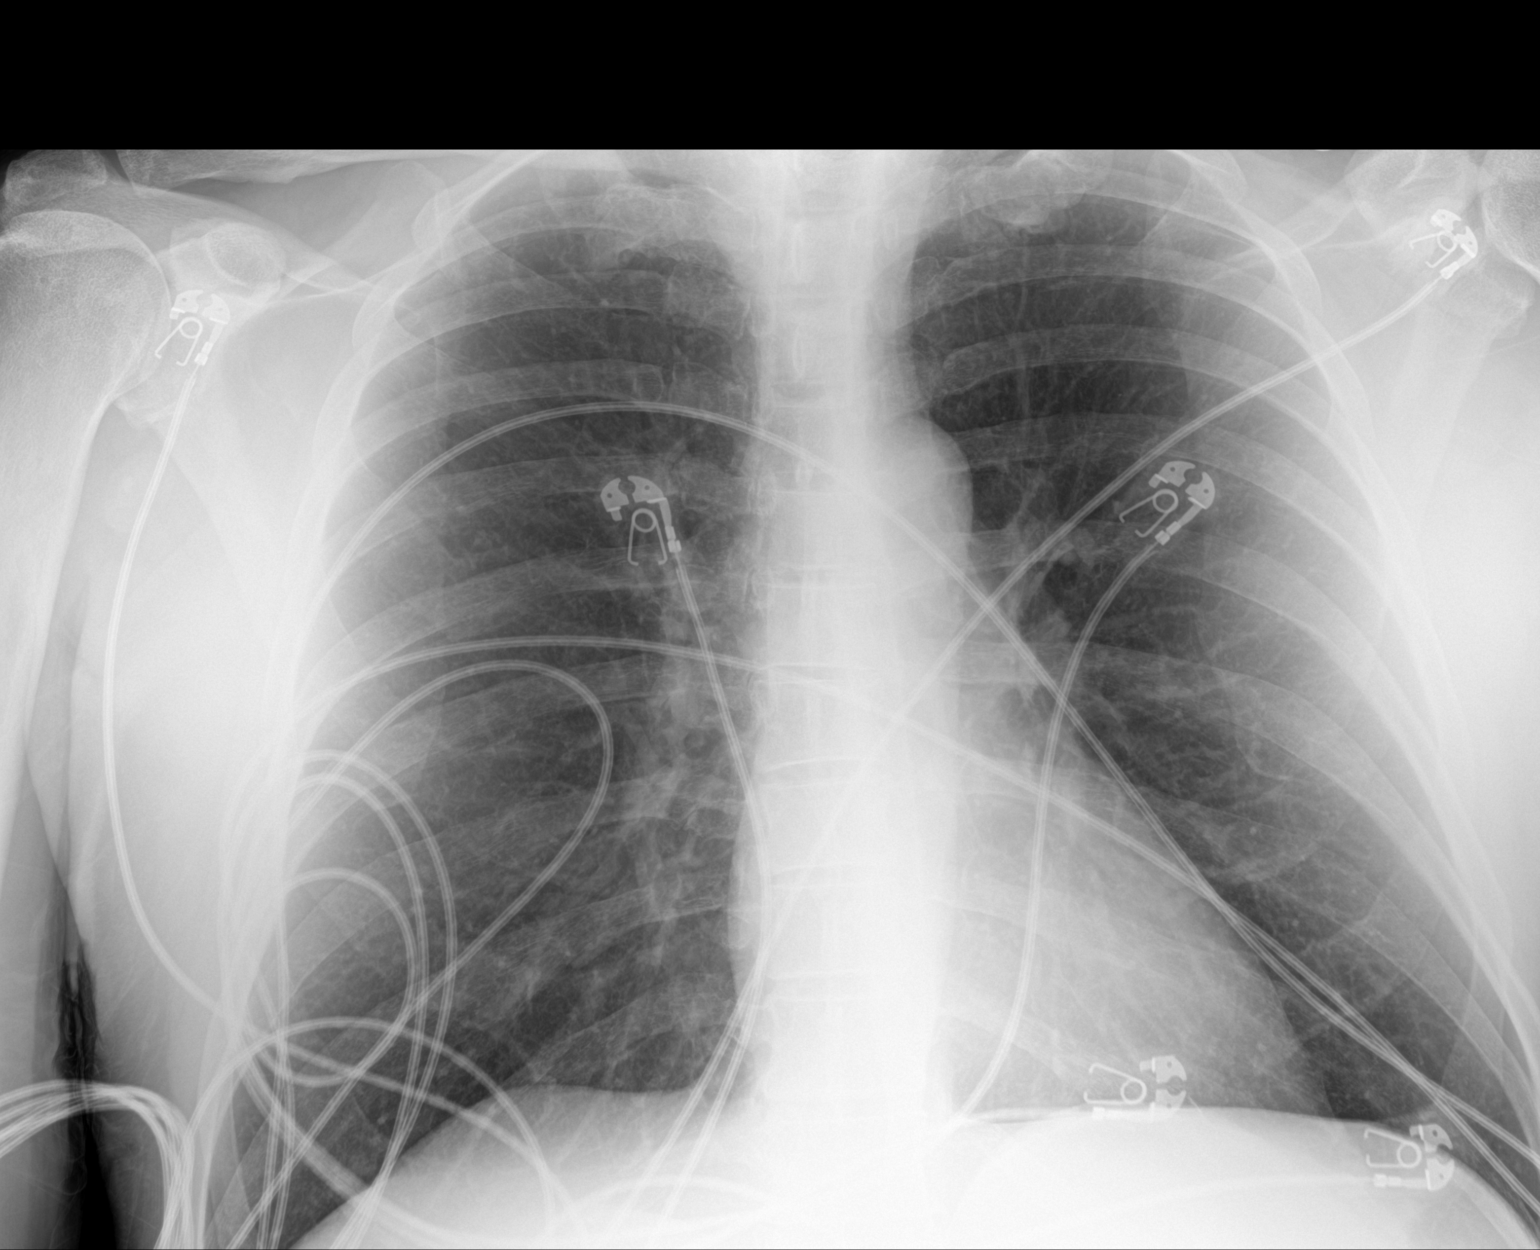

[chest ap (2 of 2)]
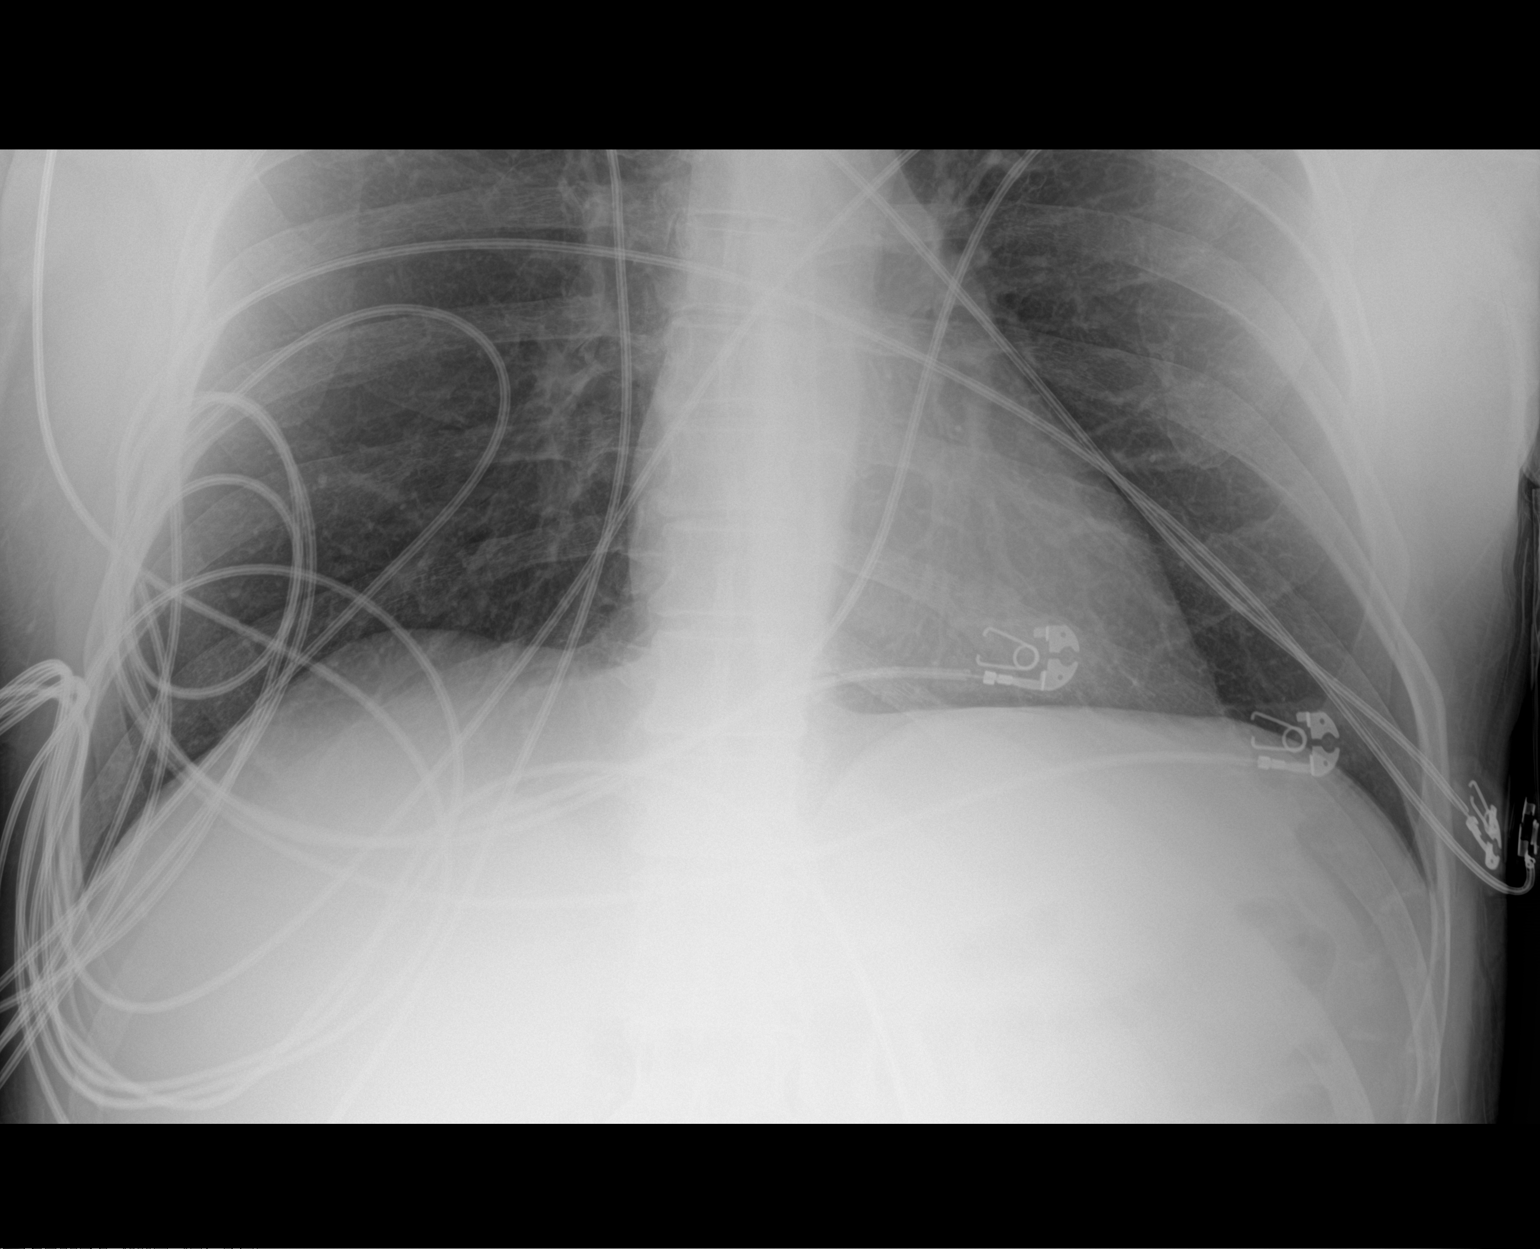

[2 of 2 positions shown; findings below may reference images not displayed]

FINDINGS: The heart size and mediastinal contours are within normal limits.
Both lungs are clear. The visualized skeletal structures are
unremarkable.
IMPRESSION: No active disease.

## 2018-04-29 IMAGING — RF DG CERVICAL SPINE 1V
1 series · 3 of 3 positions shown · non-contrast
Comparison: MRI 01/03/2017.

CLINICAL DATA: Cervical discectomy.

EXAM:
DG C-ARM 61-120 MIN; DG CERVICAL SPINE - 1 VIEW

[Series 1: run · 3 of 3 slices shown]
[im 1/3]
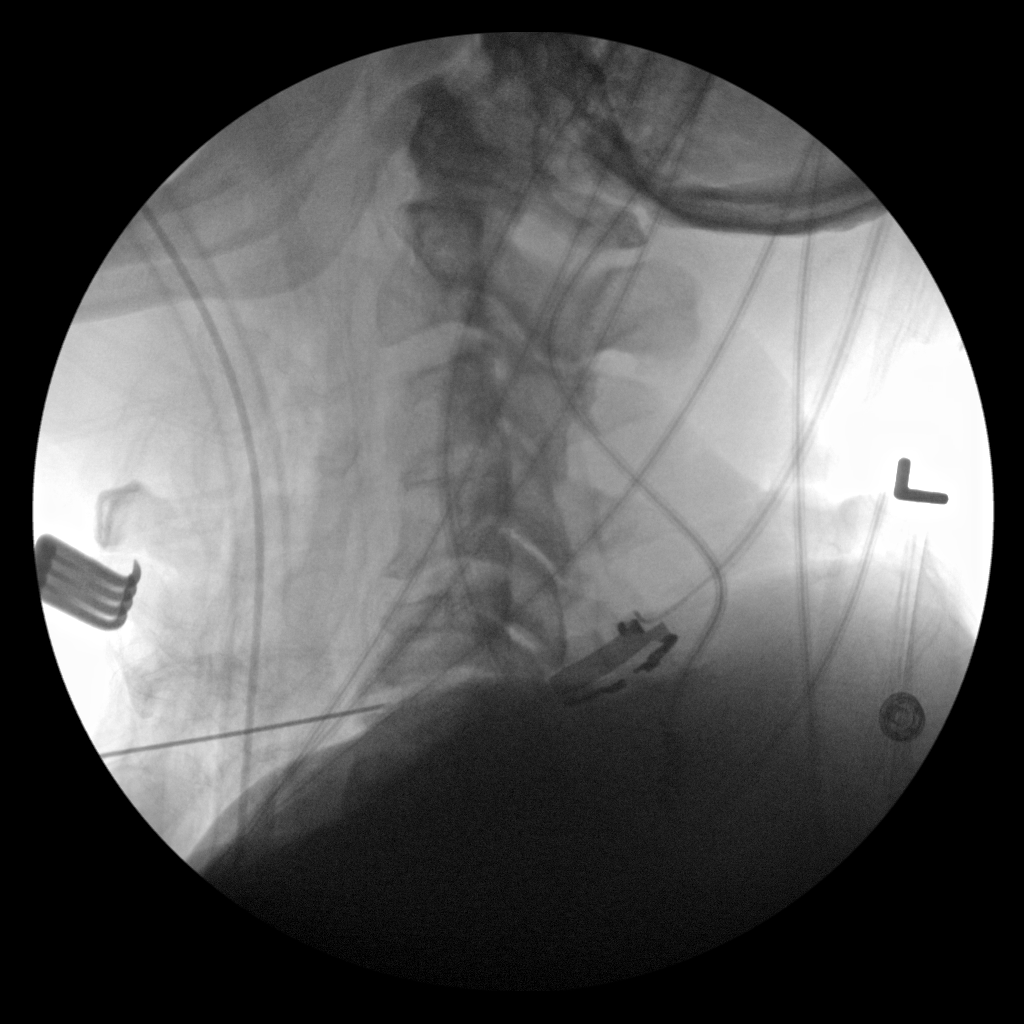
[im 2/3]
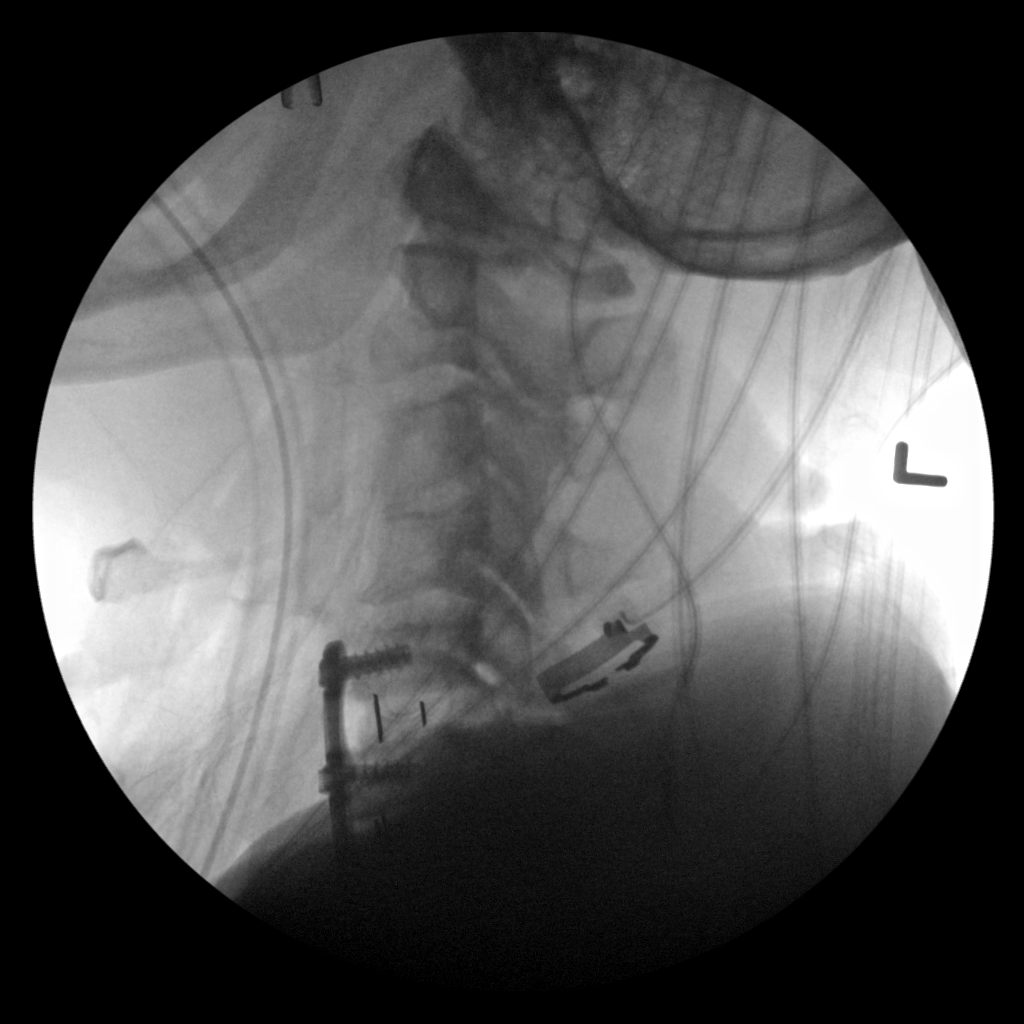
[im 3/3]
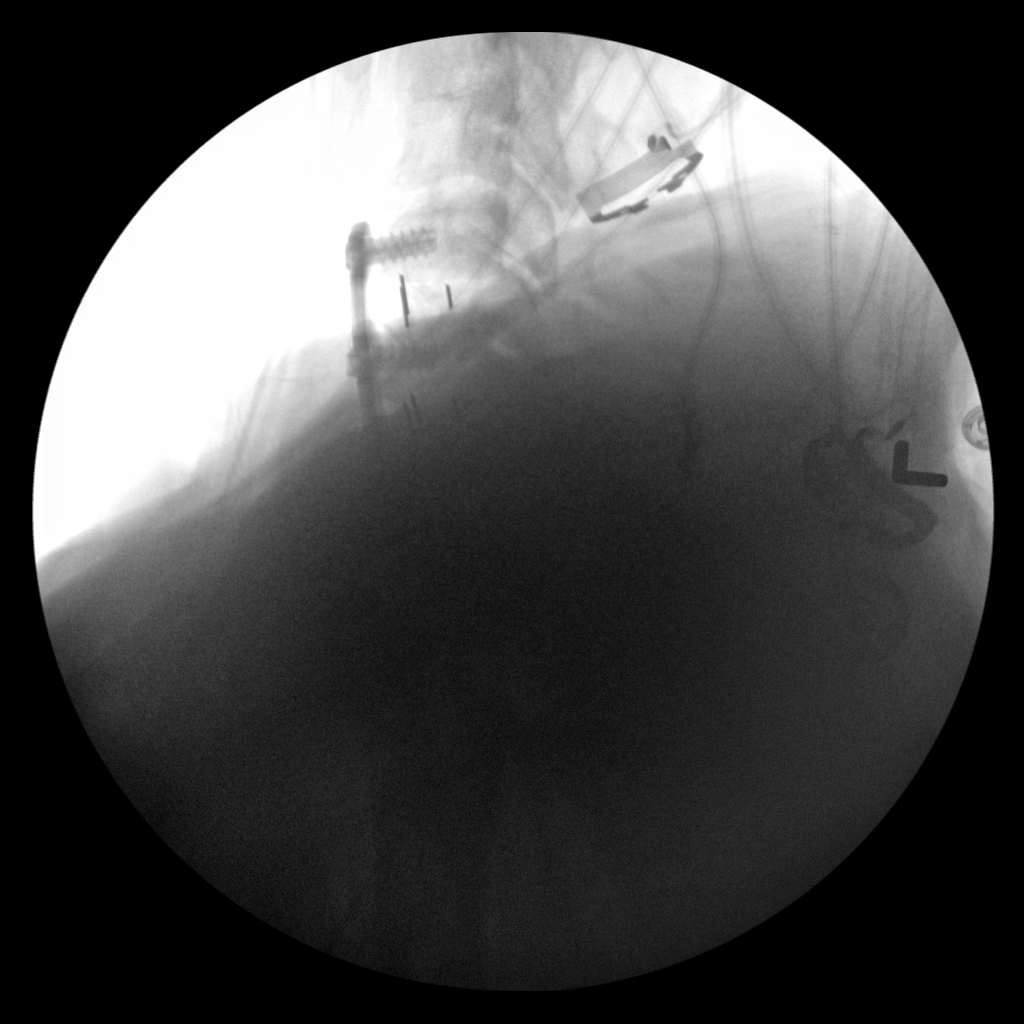

[3 of 3 positions shown; findings below may reference images not displayed]

FINDINGS: C5 through C7 anterior and interbody fusion. Hardware intact.
Anatomic alignment. No acute bony abnormality .
IMPRESSION: C5 through C7 anterior and interbody fusion. Hardware intact.
Anatomic alignment.

## 2018-06-17 NOTE — Patient Instructions (Signed)
Derek Hunter  06/17/2018     @PREFPERIOPPHARMACY @   Your procedure is scheduled on 06/28/2018.  Report to [facility] at 6:15 A.M.  Call this number if you have problems the morning of surgery:  [phone]   Remember:  Do not eat or drink after midnight.   Take these medicines the morning of surgery with A SIP OF WATER Baclofen, Lyrica, Metoprolol, Prilosec, Oxycodone, Paxil             DO NOT TAKE DIABETES MEDICATION MORNING OF PROCEDURE    Do not wear jewelry, make-up or nail polish.  Do not wear lotions, powders, or perfumes, or deodorant.  Do not shave 48 hours prior to surgery.  Men may shave face and neck.  Do not bring valuables to the hospital.  [facility] is not responsible for any belongings or valuables.  Contacts, dentures or bridgework may not be worn into surgery.  Leave your suitcase in the car.  After surgery it may be brought to your room.  For patients admitted to the hospital, discharge time will be determined by your treatment team.  Patients discharged the day of surgery will not be allowed to drive home.    Please read over the following fact sheets that you were given. Anesthesia Post-op Instructions     PATIENT INSTRUCTIONS POST-ANESTHESIA  IMMEDIATELY FOLLOWING SURGERY:  Do not drive or operate machinery for the first twenty four hours after surgery.  Do not make any important decisions for twenty four hours after surgery or while taking narcotic pain medications or sedatives.  If you develop intractable nausea and vomiting or a severe headache please notify your doctor immediately.  FOLLOW-UP:  Please make an appointment with your surgeon as instructed. You do not need to follow up with anesthesia unless specifically instructed to do so.  WOUND CARE INSTRUCTIONS (if applicable):  Keep a dry clean dressing on the anesthesia/puncture wound site if there is drainage.  Once the wound has quit draining you may leave it open to air.  Generally you  should leave the bandage intact for twenty four hours unless there is drainage.  If the epidural site drains for more than 36-48 hours please call the anesthesia department.  QUESTIONS?:  Please feel free to call your physician or the hospital operator if you have any questions, and they will be happy to assist you.      Cataract Surgery Cataract surgery is a procedure to remove a cataract from your eye. A cataract is cloudiness on the lens of your eye. The lens focuses light inside the eye. When a lens becomes cloudy, your vision is affected. Cataract surgery is a procedure to remove the cloudy lens. A substitute lens (intraocular lens or IOL) is usually inserted as a replacement for the cloudy lens. Tell a health care provider about:  Any allergies you have.  All medicines you are taking, including vitamins, herbs, eye drops, creams, and over-the-counter medicines.  Any problems you or family members have had with anesthetic medicines.  Any blood disorders you have.  Any surgeries you have had, especially eye surgeries that include refractive surgery, such as PRK and LASIK.  Any medical conditions you have.  Whether you are pregnant or may be pregnant. What are the risks? Generally, this is a safe procedure. However, problems may occur, including:  Infection.  Bleeding.  Glaucoma.  Retinal detachment.  Allergic reactions to medicines.  Damage to other structures or organs.  Inflammation of the eye.  Clouding of the part of your eye that holds an IOL in place (after-cataract), if an IOL was inserted. This is fairly common.  An IOL moving out of position, if an IOL was inserted. This is very rare.  Loss of vision. This is rare.  What happens before the procedure?  Follow instructions from your health care provider about eating or drinking restrictions.  Ask your health care provider about: ? Changing or stopping your regular medicines, including any eye drops you  have been prescribed. This is especially important if you are taking diabetes medicines or blood thinners. ? Taking medicines such as aspirin and ibuprofen. These medicines can thin your blood. Do not take these medicines before your procedure if your health care provider instructs you not to.  Do not put contact lenses in either eye on the day of your surgery.  Plan for someone to drive you to and from the procedure.  If you will be going home right after the procedure, plan to have someone with you for 24 hours. What happens during the procedure?  An IV tube may be inserted into one of your veins.  You will be given one or more of the following: ? A medicine to help you relax (sedative). ? A medicine to numb the area (local anesthetic). This may be numbing eye drops or an injection that is given behind the eye.  A small cut (incision) will be made to the edge of the clear, dome-shaped surface that covers the front of the eye (cornea).  A small probe will be inserted into the eye. This device gives off ultrasound waves that soften and break up the cloudy center of the lens. This makes it easier for the cloudy lens to be removed by suction.  An IOL may be implanted.  Part of the capsule that surrounds the lens will be left in the eye to support the IOL.  Your surgeon may use stitches (sutures) to close the incision. The procedure may vary among health care providers and hospitals. What happens after the procedure?  Your blood pressure, heart rate, breathing rate, and blood oxygen level will be monitored often until the medicines you were given have worn off.  You may be given a protective shield to wear over your eyes.  Do not drive for 24 hours if you received a sedative. This information is not intended to replace advice given to you by your health care provider. Make sure you discuss any questions you have with your health care provider. Document Released: 11/02/2011 Document  Revised: 04/20/2016 Document Reviewed: 09/23/2015 Elsevier Interactive Patient Education  [employer]2018 [employer]

## 2018-06-18 ENCOUNTER — Encounter (HOSPITAL_COMMUNITY)
Admission: RE | Admit: 2018-06-18 | Discharge: 2018-06-18 | Disposition: A | Discharge status: Home / Self Care | Payer: Medicare Other | Source: Ambulatory Visit | Attending: Ophthalmology | Admitting: Ophthalmology

## 2018-06-20 ENCOUNTER — Encounter (HOSPITAL_COMMUNITY)
Admission: RE | Admit: 2018-06-20 | Discharge: 2018-06-20 | Disposition: A | Discharge status: Home / Self Care | Payer: Medicare Other | Source: Ambulatory Visit | Attending: Ophthalmology | Admitting: Ophthalmology

## 2018-06-20 ENCOUNTER — Other Ambulatory Visit: Payer: Self-pay

## 2018-06-20 ENCOUNTER — Encounter (HOSPITAL_COMMUNITY): Payer: Self-pay

## 2018-06-20 DIAGNOSIS — M4802 Spinal stenosis, cervical region: Secondary | ICD-10-CM | POA: Insufficient documentation

## 2018-06-20 DIAGNOSIS — Z01812 Encounter for preprocedural laboratory examination: Secondary | ICD-10-CM | POA: Insufficient documentation

## 2018-06-20 DIAGNOSIS — I10 Essential (primary) hypertension: Secondary | ICD-10-CM | POA: Diagnosis not present

## 2018-06-20 DIAGNOSIS — F419 Anxiety disorder, unspecified: Secondary | ICD-10-CM | POA: Insufficient documentation

## 2018-06-20 DIAGNOSIS — E119 Type 2 diabetes mellitus without complications: Secondary | ICD-10-CM | POA: Insufficient documentation

## 2018-06-20 DIAGNOSIS — Z01810 Encounter for preprocedural cardiovascular examination: Secondary | ICD-10-CM | POA: Insufficient documentation

## 2018-06-20 DIAGNOSIS — H25812 Combined forms of age-related cataract, left eye: Secondary | ICD-10-CM | POA: Diagnosis not present

## 2018-06-20 DIAGNOSIS — K219 Gastro-esophageal reflux disease without esophagitis: Secondary | ICD-10-CM | POA: Insufficient documentation

## 2018-06-20 LAB — BASIC METABOLIC PANEL
ANION GAP: 9 (ref 5–15)
BUN: 13 mg/dL (ref 8–23)
CALCIUM: 9.4 mg/dL (ref 8.9–10.3)
CO2: 26 mmol/L (ref 22–32)
Chloride: 102 mmol/L (ref 98–111)
Creatinine, Ser: 0.93 mg/dL (ref 0.61–1.24)
GFR calc non Af Amer: >60 mL/min (ref >60)
Glucose, Bld: 276 mg/dL — ABNORMAL HIGH (ref 70–99)
Potassium: 4 mmol/L (ref 3.5–5.1)
SODIUM: 137 mmol/L (ref 135–145)

## 2018-06-20 LAB — CBC
HCT: 40 % (ref 39.0–52.0)
HEMOGLOBIN: 13.7 g/dL (ref 13.0–17.0)
MCH: 31.4 pg (ref 26.0–34.0)
MCHC: 34.3 g/dL (ref 30.0–36.0)
MCV: 91.7 fL (ref 78.0–100.0)
Platelets: 239 10*3/uL (ref 150–400)
RBC: 4.36 MIL/uL (ref 4.22–5.81)
RDW: 12.6 % (ref 11.5–15.5)
WBC: 9.1 10*3/uL (ref 4.0–10.5)

## 2018-06-20 LAB — HEMOGLOBIN A1C
HEMOGLOBIN A1C: 9.5 % — AB (ref 4.8–5.6)
MEAN PLASMA GLUCOSE: 225.95 mg/dL

## 2018-06-21 NOTE — Pre-Procedure Instructions (Signed)
HgbA1C routed to PCP. 

## 2018-06-28 ENCOUNTER — Ambulatory Visit (HOSPITAL_COMMUNITY): Payer: Medicare Other | Admitting: Anesthesiology

## 2018-06-28 ENCOUNTER — Ambulatory Visit (HOSPITAL_COMMUNITY)
Admission: RE | Admit: 2018-06-28 | Discharge: 2018-06-28 | Disposition: A | Discharge status: Home / Self Care | Payer: Medicare Other | Source: Ambulatory Visit | Attending: Ophthalmology | Admitting: Ophthalmology

## 2018-06-28 ENCOUNTER — Encounter (HOSPITAL_COMMUNITY)
Admission: RE | Disposition: A | Discharge status: Home / Self Care | Payer: Self-pay | Source: Ambulatory Visit | Attending: Ophthalmology

## 2018-06-28 DIAGNOSIS — I10 Essential (primary) hypertension: Secondary | ICD-10-CM | POA: Insufficient documentation

## 2018-06-28 DIAGNOSIS — F329 Major depressive disorder, single episode, unspecified: Secondary | ICD-10-CM | POA: Diagnosis not present

## 2018-06-28 DIAGNOSIS — K219 Gastro-esophageal reflux disease without esophagitis: Secondary | ICD-10-CM | POA: Insufficient documentation

## 2018-06-28 DIAGNOSIS — Z79899 Other long term (current) drug therapy: Secondary | ICD-10-CM | POA: Insufficient documentation

## 2018-06-28 DIAGNOSIS — Z794 Long term (current) use of insulin: Secondary | ICD-10-CM | POA: Diagnosis not present

## 2018-06-28 DIAGNOSIS — E1136 Type 2 diabetes mellitus with diabetic cataract: Secondary | ICD-10-CM | POA: Diagnosis not present

## 2018-06-28 DIAGNOSIS — H25812 Combined forms of age-related cataract, left eye: Secondary | ICD-10-CM | POA: Diagnosis not present

## 2018-06-28 DIAGNOSIS — Z87891 Personal history of nicotine dependence: Secondary | ICD-10-CM | POA: Diagnosis not present

## 2018-06-28 DIAGNOSIS — H2512 Age-related nuclear cataract, left eye: Secondary | ICD-10-CM | POA: Diagnosis not present

## 2018-06-28 DIAGNOSIS — E119 Type 2 diabetes mellitus without complications: Secondary | ICD-10-CM | POA: Diagnosis not present

## 2018-06-28 DIAGNOSIS — Z880 Allergy status to penicillin: Secondary | ICD-10-CM | POA: Insufficient documentation

## 2018-06-28 HISTORY — PX: CATARACT EXTRACTION W/PHACO: SHX586

## 2018-06-28 LAB — GLUCOSE, CAPILLARY: Glucose-Capillary: 200 mg/dL — ABNORMAL HIGH (ref 70–99)

## 2018-06-28 SURGERY — PHACOEMULSIFICATION, CATARACT, WITH IOL INSERTION
Anesthesia: Monitor Anesthesia Care | Site: Eye | Laterality: Left

## 2018-06-28 MED ORDER — EPINEPHRINE PF 1 MG/ML IJ SOLN
INTRAOCULAR | Status: DC | PRN
  Administered 2018-06-28: 500 mL

## 2018-06-28 MED ORDER — BSS IO SOLN
INTRAOCULAR | Status: DC | PRN
  Administered 2018-06-28: 15 mL

## 2018-06-28 MED ORDER — MIDAZOLAM HCL 5 MG/5ML IJ SOLN
INTRAMUSCULAR | Status: DC | PRN
  Administered 2018-06-28: 2 mg via INTRAVENOUS

## 2018-06-28 MED ORDER — TETRACAINE HCL 0.5 % OP SOLN
OPHTHALMIC | Status: AC
  Filled 2018-06-28: qty 4

## 2018-06-28 MED ORDER — LIDOCAINE HCL 3.5 % OP GEL
1.0000 "application " | Freq: Once | OPHTHALMIC | Status: AC
  Administered 2018-06-28: 1 via OPHTHALMIC

## 2018-06-28 MED ORDER — MIDAZOLAM HCL 2 MG/2ML IJ SOLN
INTRAMUSCULAR | Status: AC
  Filled 2018-06-28: qty 2

## 2018-06-28 MED ORDER — PROPOFOL 10 MG/ML IV BOLUS
INTRAVENOUS | Status: AC
  Filled 2018-06-28: qty 20

## 2018-06-28 MED ORDER — LIDOCAINE HCL (PF) 1 % IJ SOLN
INTRAOCULAR | Status: DC | PRN
  Administered 2018-06-28: 1.5 mL via OPHTHALMIC

## 2018-06-28 MED ORDER — CYCLOPENTOLATE-PHENYLEPHRINE 0.2-1 % OP SOLN
1.0000 [drp] | OPHTHALMIC | Status: AC
  Administered 2018-06-28 (×3): 1 [drp] via OPHTHALMIC

## 2018-06-28 MED ORDER — POVIDONE-IODINE 5 % OP SOLN
OPHTHALMIC | Status: DC | PRN
  Administered 2018-06-28: 1 via OPHTHALMIC

## 2018-06-28 MED ORDER — FENTANYL CITRATE (PF) 100 MCG/2ML IJ SOLN
INTRAMUSCULAR | Status: AC
  Filled 2018-06-28: qty 2

## 2018-06-28 MED ORDER — CYCLOPENTOLATE-PHENYLEPHRINE 0.2-1 % OP SOLN
OPHTHALMIC | Status: AC
  Filled 2018-06-28: qty 2

## 2018-06-28 MED ORDER — PROVISC 10 MG/ML IO SOLN
INTRAOCULAR | Status: DC | PRN
  Administered 2018-06-28: 0.85 mL via INTRAOCULAR

## 2018-06-28 MED ORDER — NEOMYCIN-POLYMYXIN-DEXAMETH 3.5-10000-0.1 OP SUSP
OPHTHALMIC | Status: DC | PRN
  Administered 2018-06-28: 2 [drp] via OPHTHALMIC

## 2018-06-28 MED ORDER — SODIUM HYALURONATE 23 MG/ML IO SOLN
INTRAOCULAR | Status: DC | PRN
  Administered 2018-06-28: 0.6 mL via INTRAOCULAR

## 2018-06-28 MED ORDER — PHENYLEPHRINE HCL 2.5 % OP SOLN
OPHTHALMIC | Status: AC
  Filled 2018-06-28: qty 15

## 2018-06-28 MED ORDER — TETRACAINE HCL 0.5 % OP SOLN
1.0000 [drp] | OPHTHALMIC | Status: AC
  Administered 2018-06-28 (×3): 1 [drp] via OPHTHALMIC

## 2018-06-28 MED ORDER — PHENYLEPHRINE HCL 2.5 % OP SOLN
1.0000 [drp] | OPHTHALMIC | Status: AC
  Administered 2018-06-28 (×3): 1 [drp] via OPHTHALMIC

## 2018-06-28 MED ORDER — FENTANYL CITRATE (PF) 100 MCG/2ML IJ SOLN
INTRAMUSCULAR | Status: DC | PRN
  Administered 2018-06-28 (×2): 50 ug via INTRAVENOUS

## 2018-06-28 MED ORDER — LIDOCAINE HCL 3.5 % OP GEL
OPHTHALMIC | Status: AC
  Filled 2018-06-28: qty 1

## 2018-06-28 MED ORDER — NEOMYCIN-POLYMYXIN-DEXAMETH 3.5-10000-0.1 OP SUSP
OPHTHALMIC | Status: AC
  Filled 2018-06-28: qty 5

## 2018-06-28 MED ORDER — LIDOCAINE HCL (PF) 1 % IJ SOLN
INTRAMUSCULAR | Status: AC
  Filled 2018-06-28: qty 2

## 2018-06-28 MED ORDER — LACTATED RINGERS IV SOLN
INTRAVENOUS | Status: DC | PRN
  Administered 2018-06-28: 07:00:00 via INTRAVENOUS

## 2018-06-28 SURGICAL SUPPLY — 13 items

## 2018-06-28 NOTE — Op Note (Signed)
Date of procedure: 06/28/18  Pre-operative diagnosis: Visually significant cataract, Left Eye (H25.812)  Post-operative diagnosis: Visually significant cataract, Left Eye  Procedure: Removal of cataract via phacoemulsification and insertion of intra-ocular lens [employer]  +18.0D into the capsular bag of the Left Eye  Attending surgeon: Gerda Diss. Owyn Raulston, MD, MA  Anesthesia: MAC, Topical Akten  Complications: None  Estimated Blood Loss: <58m (minimal)  Specimens: None  Implants: As above  Indications:  Visually significant cataract, Left Eye  Procedure:  The patient was seen and identified in the pre-operative area. The operative eye was identified and dilated.  The operative eye was marked.  Topical anesthesia was administered to the operative eye.     The patient was then to the operative suite and placed in the supine position.  A timeout was performed confirming the patient, procedure to be performed, and all other relevant information.   The patient's face was prepped and draped in the usual fashion for intra-ocular surgery.  A lid speculum was placed into the operative eye and the surgical microscope moved into place and focused.  An inferotemporal paracentesis was created using a 20 gauge paracentesis blade.  Shugarcaine was injected into the anterior chamber.  Viscoelastic was injected into the anterior chamber.  A temporal clear-corneal main wound incision was created using a 2.487mmicrokeratome.  A continuous curvilinear capsulorrhexis was initiated using an irrigating cystitome and completed using capsulorrhexis forceps.  Hydrodissection and hydrodeliniation were performed.  Viscoelastic was injected into the anterior chamber.  A phacoemulsification handpiece and a chopper as a second instrument were used to remove the nucleus and epinucleus. The irrigation/aspiration handpiece was used to remove any remaining cortical material.   The capsular bag was  reinflated with viscoelastic, checked, and found to be intact.  The intraocular lens was inserted into the capsular bag and dialed into place using a Kuglen hook.  The irrigation/aspiration handpiece was used to remove any remaining viscoelastic.  The clear corneal wound and paracentesis wounds were then hydrated and checked with Weck-Cels to be watertight.  The lid-speculum and drape was removed, and the patient's face was cleaned with a wet and dry 4x4.  Maxitrol was instilled in the eye before a clear shield was taped over the eye. The patient was taken to the post-operative care unit in good condition, having tolerated the procedure well.  Post-Op Instructions: [facility] and will receive all other orders and instructions.

## 2018-06-28 NOTE — Transfer of Care (Signed)
Immediate Anesthesia Transfer of Care Note  Patient: Derek Hunter  Procedure(s) Performed: CATARACT EXTRACTION PHACO AND INTRAOCULAR LENS PLACEMENT (IOC) (Left Eye)  Patient Location: PACU  Anesthesia Type:MAC  Level of Consciousness: awake, alert  and oriented  Airway & Oxygen Therapy: Patient Spontanous Breathing and Patient connected to nasal cannula oxygen  Post-op Assessment: Report given to RN and Post -op Vital signs reviewed and stable  Post vital signs: Reviewed and stable  Last Vitals:  Vitals Value Taken Time  BP    Temp    Pulse    Resp    SpO2      Last Pain:  Vitals:   06/28/18 0641  PainSc: 0-No pain         Complications: No apparent anesthesia complications

## 2018-06-28 NOTE — H&P (Signed)
The H and P was reviewed and updated. The patient was examined.  No changes were found after exam.  The surgical eye was marked.  

## 2018-06-28 NOTE — Anesthesia Postprocedure Evaluation (Signed)
Anesthesia Post Note  Patient: Derek Hunter  Procedure(s) Performed: CATARACT EXTRACTION PHACO AND INTRAOCULAR LENS PLACEMENT (IOC) (Left Eye)  Patient location during evaluation: PACU Anesthesia Type: MAC Level of consciousness: awake and alert and oriented Pain management: pain level controlled Vital Signs Assessment: post-procedure vital signs reviewed and stable Respiratory status: spontaneous breathing, patient connected to nasal cannula oxygen and nonlabored ventilation Cardiovascular status: stable Postop Assessment: no apparent nausea or vomiting Anesthetic complications: no     Last Vitals:  Vitals:   06/28/18 0715 06/28/18 0753  BP:  132/75  Pulse:  76  Resp: 11 16  Temp:  36.8 C  SpO2:  96%    Last Pain:  Vitals:   06/28/18 0753  TempSrc: Oral  PainSc: 0-No pain                 Markiesha Delia

## 2018-06-28 NOTE — Discharge Instructions (Signed)
Please discharge patient when stable, will follow [facility] today [facility].  Leave shield in place until visit.  All paperwork with discharge instructions will be given at the office.

## 2018-06-28 NOTE — Anesthesia Preprocedure Evaluation (Signed)
Anesthesia Evaluation  Patient identified by MRN, date of birth, ID band Patient awake    Reviewed: Allergy & Precautions, H&P , NPO status , Patient's Chart, lab work & pertinent test results, reviewed documented beta blocker date and time   Airway Mallampati: II  TM Distance: >3 FB Neck ROM: full    Dental no notable dental hx.    Pulmonary neg pulmonary ROS, former smoker,    Pulmonary exam normal breath sounds clear to auscultation       Cardiovascular Exercise Tolerance: Good hypertension, negative cardio ROS   Rhythm:regular Rate:Normal     Neuro/Psych negative neurological ROS  negative psych ROS   GI/Hepatic negative GI ROS, Neg liver ROS, GERD  ,  Endo/Other  negative endocrine ROSdiabetes  Renal/GU negative Renal ROS  negative genitourinary   Musculoskeletal   Abdominal   Peds  Hematology negative hematology ROS (+)   Anesthesia Other Findings "Pseudo-dementia"?  Reproductive/Obstetrics negative OB ROS                             Anesthesia Physical Anesthesia Plan  ASA: III  Anesthesia Plan: MAC   Post-op Pain Management:    Induction:   PONV Risk Score and Plan:   Airway Management Planned:   Additional Equipment:   Intra-op Plan:   Post-operative Plan:   Informed Consent: I have reviewed the patients History and Physical, chart, labs and discussed the procedure including the risks, benefits and alternatives for the proposed anesthesia with the patient or authorized representative who has indicated his/her understanding and acceptance.   Dental Advisory Given  Plan Discussed with: CRNA and Anesthesiologist  Anesthesia Plan Comments:         Anesthesia Quick Evaluation

## 2018-07-01 ENCOUNTER — Encounter (HOSPITAL_COMMUNITY): Payer: Self-pay | Admitting: Ophthalmology

## 2018-07-02 DIAGNOSIS — H25811 Combined forms of age-related cataract, right eye: Secondary | ICD-10-CM | POA: Diagnosis not present

## 2018-07-10 ENCOUNTER — Other Ambulatory Visit: Payer: Self-pay

## 2018-07-10 ENCOUNTER — Encounter (HOSPITAL_COMMUNITY): Payer: Self-pay

## 2018-07-10 ENCOUNTER — Encounter (HOSPITAL_COMMUNITY)
Admission: RE | Admit: 2018-07-10 | Discharge: 2018-07-10 | Disposition: A | Discharge status: Home / Self Care | Payer: Medicare Other | Source: Ambulatory Visit | Attending: Ophthalmology | Admitting: Ophthalmology

## 2018-07-12 ENCOUNTER — Ambulatory Visit (HOSPITAL_COMMUNITY)
Admission: RE | Admit: 2018-07-12 | Discharge: 2018-07-12 | Disposition: A | Discharge status: Home / Self Care | Payer: Medicare Other | Source: Ambulatory Visit | Attending: Ophthalmology | Admitting: Ophthalmology

## 2018-07-12 ENCOUNTER — Encounter (HOSPITAL_COMMUNITY)
Admission: RE | Disposition: A | Discharge status: Home / Self Care | Payer: Self-pay | Source: Ambulatory Visit | Attending: Ophthalmology

## 2018-07-12 ENCOUNTER — Ambulatory Visit (HOSPITAL_COMMUNITY): Payer: Medicare Other | Admitting: Anesthesiology

## 2018-07-12 ENCOUNTER — Encounter (HOSPITAL_COMMUNITY): Payer: Self-pay

## 2018-07-12 DIAGNOSIS — K219 Gastro-esophageal reflux disease without esophagitis: Secondary | ICD-10-CM | POA: Diagnosis not present

## 2018-07-12 DIAGNOSIS — E1136 Type 2 diabetes mellitus with diabetic cataract: Secondary | ICD-10-CM | POA: Insufficient documentation

## 2018-07-12 DIAGNOSIS — Z7984 Long term (current) use of oral hypoglycemic drugs: Secondary | ICD-10-CM | POA: Insufficient documentation

## 2018-07-12 DIAGNOSIS — H25811 Combined forms of age-related cataract, right eye: Secondary | ICD-10-CM | POA: Diagnosis not present

## 2018-07-12 DIAGNOSIS — F329 Major depressive disorder, single episode, unspecified: Secondary | ICD-10-CM | POA: Diagnosis not present

## 2018-07-12 DIAGNOSIS — F419 Anxiety disorder, unspecified: Secondary | ICD-10-CM | POA: Insufficient documentation

## 2018-07-12 DIAGNOSIS — Z79899 Other long term (current) drug therapy: Secondary | ICD-10-CM | POA: Insufficient documentation

## 2018-07-12 DIAGNOSIS — Z87891 Personal history of nicotine dependence: Secondary | ICD-10-CM | POA: Insufficient documentation

## 2018-07-12 DIAGNOSIS — I10 Essential (primary) hypertension: Secondary | ICD-10-CM | POA: Insufficient documentation

## 2018-07-12 DIAGNOSIS — F0390 Unspecified dementia without behavioral disturbance: Secondary | ICD-10-CM | POA: Diagnosis not present

## 2018-07-12 DIAGNOSIS — H2511 Age-related nuclear cataract, right eye: Secondary | ICD-10-CM | POA: Diagnosis not present

## 2018-07-12 HISTORY — PX: CATARACT EXTRACTION W/PHACO: SHX586

## 2018-07-12 LAB — GLUCOSE, CAPILLARY: Glucose-Capillary: 153 mg/dL — ABNORMAL HIGH (ref 70–99)

## 2018-07-12 SURGERY — PHACOEMULSIFICATION, CATARACT, WITH IOL INSERTION
Anesthesia: Monitor Anesthesia Care | Site: Eye | Laterality: Right

## 2018-07-12 MED ORDER — MIDAZOLAM HCL 2 MG/2ML IJ SOLN
INTRAMUSCULAR | Status: AC
  Filled 2018-07-12: qty 2

## 2018-07-12 MED ORDER — LIDOCAINE HCL (PF) 1 % IJ SOLN
INTRAOCULAR | Status: DC | PRN
  Administered 2018-07-12: 1 mL via OPHTHALMIC

## 2018-07-12 MED ORDER — EPINEPHRINE PF 1 MG/ML IJ SOLN
INTRAOCULAR | Status: DC | PRN
  Administered 2018-07-12: 500 mL

## 2018-07-12 MED ORDER — BSS IO SOLN
INTRAOCULAR | Status: DC | PRN
  Administered 2018-07-12: 15 mL

## 2018-07-12 MED ORDER — MIDAZOLAM HCL 5 MG/5ML IJ SOLN
INTRAMUSCULAR | Status: DC | PRN
  Administered 2018-07-12: 2 mg via INTRAVENOUS

## 2018-07-12 MED ORDER — CYCLOPENTOLATE-PHENYLEPHRINE 0.2-1 % OP SOLN
1.0000 [drp] | OPHTHALMIC | Status: AC
  Administered 2018-07-12 (×3): 1 [drp] via OPHTHALMIC

## 2018-07-12 MED ORDER — LIDOCAINE HCL 3.5 % OP GEL
1.0000 "application " | Freq: Once | OPHTHALMIC | Status: AC
  Administered 2018-07-12: 1 via OPHTHALMIC

## 2018-07-12 MED ORDER — TETRACAINE HCL 0.5 % OP SOLN
1.0000 [drp] | OPHTHALMIC | Status: AC
  Administered 2018-07-12 (×3): 1 [drp] via OPHTHALMIC

## 2018-07-12 MED ORDER — LACTATED RINGERS IV SOLN
INTRAVENOUS | Status: DC | PRN
  Administered 2018-07-12: 10:00:00 via INTRAVENOUS

## 2018-07-12 MED ORDER — NEOMYCIN-POLYMYXIN-DEXAMETH 3.5-10000-0.1 OP SUSP
OPHTHALMIC | Status: DC | PRN
  Administered 2018-07-12: 2 [drp] via OPHTHALMIC

## 2018-07-12 MED ORDER — PHENYLEPHRINE HCL 2.5 % OP SOLN
1.0000 [drp] | OPHTHALMIC | Status: AC
  Administered 2018-07-12 (×3): 1 [drp] via OPHTHALMIC

## 2018-07-12 MED ORDER — SODIUM HYALURONATE 23 MG/ML IO SOLN
INTRAOCULAR | Status: DC | PRN
  Administered 2018-07-12: 0.6 mL via INTRAOCULAR

## 2018-07-12 MED ORDER — PROVISC 10 MG/ML IO SOLN
INTRAOCULAR | Status: DC | PRN
  Administered 2018-07-12: 0.85 mL via INTRAOCULAR

## 2018-07-12 MED ORDER — POVIDONE-IODINE 5 % OP SOLN
OPHTHALMIC | Status: DC | PRN
  Administered 2018-07-12: 1 via OPHTHALMIC

## 2018-07-12 SURGICAL SUPPLY — 16 items

## 2018-07-12 NOTE — Anesthesia Postprocedure Evaluation (Signed)
Anesthesia Post Note  Patient: Derek Hunter  Procedure(s) Performed: CATARACT EXTRACTION PHACO AND INTRAOCULAR LENS PLACEMENT (IOC) (Right Eye)  Patient location during evaluation: Short Stay Anesthesia Type: MAC Level of consciousness: awake and alert, oriented and patient cooperative Pain management: pain level controlled Vital Signs Assessment: post-procedure vital signs reviewed and stable Respiratory status: spontaneous breathing Cardiovascular status: stable Postop Assessment: no apparent nausea or vomiting Anesthetic complications: no     Last Vitals:  Vitals:   07/12/18 1005 07/12/18 1010  BP: (!) 141/88   Pulse: 96   Resp: 16 13  Temp: 36.6 C   SpO2: 97% 96%    Last Pain:  Vitals:   07/12/18 1005  TempSrc: Oral  PainSc: 0-No pain                 ADAMS, AMY A

## 2018-07-12 NOTE — H&P (Signed)
The H and P was reviewed and updated. The patient was examined.  No changes were found after exam.  The surgical eye was marked.  

## 2018-07-12 NOTE — Anesthesia Preprocedure Evaluation (Signed)
Anesthesia Evaluation  Patient identified by MRN, date of birth, ID band Patient awake    Reviewed: Allergy & Precautions, H&P , NPO status , Patient's Chart, lab work & pertinent test results, reviewed documented beta blocker date and time   Airway Mallampati: II  TM Distance: >3 FB Neck ROM: full    Dental no notable dental hx.    Pulmonary neg pulmonary ROS, former smoker,    Pulmonary exam normal breath sounds clear to auscultation       Cardiovascular Exercise Tolerance: Good hypertension, negative cardio ROS   Rhythm:regular Rate:Normal     Neuro/Psych PSYCHIATRIC DISORDERS Anxiety Depression Memory loss, Dementia per daughternegative neurological ROS  negative psych ROS   GI/Hepatic negative GI ROS, Neg liver ROS, GERD  ,  Endo/Other  negative endocrine ROSdiabetes  Renal/GU negative Renal ROS  negative genitourinary   Musculoskeletal   Abdominal   Peds  Hematology negative hematology ROS (+)   Anesthesia Other Findings "Pseudo-dementia"?  Reproductive/Obstetrics negative OB ROS                             Anesthesia Physical  Anesthesia Plan  ASA: III  Anesthesia Plan: MAC   Post-op Pain Management:    Induction:   PONV Risk Score and Plan:   Airway Management Planned:   Additional Equipment:   Intra-op Plan:   Post-operative Plan:   Informed Consent: I have reviewed the patients History and Physical, chart, labs and discussed the procedure including the risks, benefits and alternatives for the proposed anesthesia with the patient or authorized representative who has indicated his/her understanding and acceptance.     Plan Discussed with: CRNA and Anesthesiologist  Anesthesia Plan Comments:         Anesthesia Quick Evaluation

## 2018-07-12 NOTE — Op Note (Signed)
Date of procedure: 07/12/18  Pre-operative diagnosis: Visually significant cataract, Right Eye (H25.811)  Post-operative diagnosis: Visually significant cataract, Right Eye  Procedure: Removal of cataract via phacoemulsification and insertion of intra-ocular lens [employer] PCB00  +18.0D into the capsular bag of the Right Eye  Attending surgeon: Gerda Diss. Skyylar Kopf, MD, MA  Anesthesia: MAC, Topical Akten  Complications: None  Estimated Blood Loss: <34m (minimal)  Specimens: None  Implants: As above  Indications:  Visually significant cataract, Right Eye  Procedure:  The patient was seen and identified in the pre-operative area. The operative eye was identified and dilated.  The operative eye was marked.  Topical anesthesia was administered to the operative eye.     The patient was then to the operative suite and placed in the supine position.  A timeout was performed confirming the patient, procedure to be performed, and all other relevant information.   The patient's face was prepped and draped in the usual fashion for intra-ocular surgery.  A lid speculum was placed into the operative eye and the surgical microscope moved into place and focused.  A superotemporal paracentesis was created using a 20 gauge paracentesis blade.  Shugarcaine was injected into the anterior chamber.  Viscoelastic was injected into the anterior chamber.  A temporal clear-corneal main wound incision was created using a 2.474mmicrokeratome.  A continuous curvilinear capsulorrhexis was initiated using an irrigating cystitome and completed using capsulorrhexis forceps.  Hydrodissection and hydrodeliniation were performed.  Viscoelastic was injected into the anterior chamber.  A phacoemulsification handpiece and a chopper as a second instrument were used to remove the nucleus and epinucleus. The irrigation/aspiration handpiece was used to remove any remaining cortical material.   The capsular bag was  reinflated with viscoelastic, checked, and found to be intact.  The intraocular lens was inserted into the capsular bag and dialed into place using a Kuglen hook.  The irrigation/aspiration handpiece was used to remove any remaining viscoelastic.  The clear corneal wound and paracentesis wounds were then hydrated and checked with Weck-Cels to be watertight.  The lid-speculum and drape was removed, and the patient's face was cleaned with a wet and dry 4x4.  Maxitrol was instilled in the eye before a clear shield was taped over the eye. The patient was taken to the post-operative care unit in good condition, having tolerated the procedure well.  Post-Op Instructions: [facility] and will receive all other orders and instructions.

## 2018-07-12 NOTE — Anesthesia Procedure Notes (Signed)
Procedure Name: MAC Performed by: Adams, Amy A, CRNA Pre-anesthesia Checklist: Patient identified, Emergency Drugs available, Suction available, Timeout performed and Patient being monitored Patient Re-evaluated:Patient Re-evaluated prior to induction Oxygen Delivery Method: Nasal Cannula       

## 2018-07-12 NOTE — Transfer of Care (Signed)
Immediate Anesthesia Transfer of Care Note  Patient: Derek Hunter  Procedure(s) Performed: CATARACT EXTRACTION PHACO AND INTRAOCULAR LENS PLACEMENT (IOC) (Right Eye)  Patient Location: Short Stay  Anesthesia Type:MAC  Level of Consciousness: awake, alert , oriented and patient cooperative  Airway & Oxygen Therapy: Patient Spontanous Breathing  Post-op Assessment: Report given to RN and Post -op Vital signs reviewed and stable  Post vital signs: Reviewed and stable  Last Vitals:  Vitals Value Taken Time  BP    Temp    Pulse    Resp    SpO2      Last Pain:  Vitals:   07/12/18 1005  TempSrc: Oral  PainSc: 0-No pain         Complications: No apparent anesthesia complications

## 2018-07-12 NOTE — Discharge Instructions (Addendum)
Please discharge patient when stable, will follow up today with Dr. Wrzosek at the [facility] office immediately following discharge.  Leave shield in place until visit.  All paperwork with discharge instructions will be given at the office. ° ° °Cataract °A cataract is cloudiness on the lens of your eye. The lens is the clear part of your eye that is behind your iris and pupil. The lens focuses light on the retina, which lets you see clearly. °When a lens becomes cloudy, vision may become blurry. The clouding can range from a tiny dot to complete cloudiness. As some cataracts develop, they make a person more nearsighted. Other cataracts increase glare. Cataracts can worsen over time, and sometimes the pupil can look white. Cataracts get bigger and they cloud more of the lens, making it difficult to see. Cataracts can affect one eye or both eyes. °What are the causes? °Most cataracts are associated with age-related eye changes. The eye lens is mostly made up of water and protein. Normally, this protein is arranged in a way that keeps the lens clear. Cataracts develop when protein begins to clump together over time. This clouds the lens and lets less light pass through to the retina, which causes blurry vision. °What increases the risk? °This condition is more likely to develop in people who: °· Are 60 years of age or older. °· Have diabetes. °· Have high blood pressure. °· Take certain medicines, such as steroids or hormone replacement therapy. °· Have had an eye injury. °· Have or have had eye inflammation. °· Have a family history of cataracts. °· Smoke. °· Drink alcohol heavily. °· Are frequently exposed to sun or very strong light without eye protection. °· Are obese. °· Have been exposed to large amounts of radiation, lead, or other toxic substances. °· Have had eye surgery. ° °What are the signs or symptoms? °The main symptom of a cataract is blurry vision. Your vision may change or get worse over  time. Other symptoms include: °· Increased glare. °· Seeing a bright ring or halo around light. °· Poor night vision. °· Double vision in one eye. °· Having trouble seeing, even while wearing contact lenses or glasses. °· Seeing colors that appear faded. °· Trouble telling the difference between blue and purple. °· Needing frequent changes to your prescription glasses or contacts. ° °How is this diagnosed? °This condition is diagnosed with a medical history and eye exam. You may need to see an eye specialist (optometrist or ophthalmologist). Your health care provider may enlarge (dilate) your pupils with eye drops to see the back of your eye more clearly and look for signs of cataracts or other damage. °You may also have tests, including: °· A visual acuity test. This uses a chart to determine the smallest letters that you can see from a specific distance. °· A slit-lamp exam. This uses a microscope to examine small sections of your eye for abnormalities. °· Tonometry. This test measures the pressure of the fluid inside your eye. ° °How is this treated? °Treatment depends on the stage of your cataract. For an early cataract, vision may improve by using different eyeglasses or stronger lighting. If that does not help your vision, surgery may be recommended to remove the cataract. °If your health care provider thinks your cataract may be linked to any medicines that you are taking, he or she may change your medicines. °Follow these instructions at home: °Lifestyle °· Use stronger or brighter lighting. °· Consider using a magnifying   glass for reading or other activities. °· Become familiar with your surroundings. Having poor vision can put you at a greater risk for tripping, falling, or bumping into things. °· Wear sunglasses and a hat if you are sensitive to bright light or are having problems with glare. °· Quit smoking if you smoke. If you need help quitting, talk with your health care provider. °General  instructions °· If you are prescribed new eyeglasses, wear them as told by your health care provider. °· Take over-the-counter and prescription medicines only as told by your health care provider. Do not change your medicines unless told by your health care provider. °· Do not drive or operate heavy machinery if your vision is blurry, particularly at night. °· Keep your blood sugar under control, if you have diabetes. °· Keep all follow-up visits as told by your health care provider. This is important. °Contact a health care provider if: °· Your symptoms get worse. °· Your vision affects your ability to perform daily activities. °· You have new symptoms. °· You have a fever. °Get help right away if: °· You have sudden vision loss. °· You have redness, swelling, or increasing pain in your eye. °· You develop a headache and sensitivity to light. °This information is not intended to replace advice given to you by your health care provider. Make sure you discuss any questions you have with your health care provider. °Document Released: 11/13/2005 Document Revised: 03/23/2016 Document Reviewed: 05/19/2015 °Elsevier Interactive Patient Education © 2018 Elsevier Inc. ° ° ° °Monitored Anesthesia Care °Anesthesia is a term that refers to techniques, procedures, and medicines that help a person stay safe and comfortable during a medical procedure. Monitored anesthesia care, or sedation, is one type of anesthesia. Your anesthesia specialist may recommend sedation if you will be having a procedure that does not require you to be unconscious, such as: °· Cataract surgery. °· A dental procedure. °· A biopsy. °· A colonoscopy. ° °During the procedure, you may receive a medicine to help you relax (sedative). There are three levels of sedation: °· Mild sedation. At this level, you may feel awake and relaxed. You will be able to follow directions. °· Moderate sedation. At this level, you will be sleepy. You may not remember the  procedure. °· Deep sedation. At this level, you will be asleep. You will not remember the procedure. ° °The more medicine you are given, the deeper your level of sedation will be. Depending on how you respond to the procedure, the anesthesia specialist may change your level of sedation or the type of anesthesia to fit your needs. An anesthesia specialist will monitor you closely during the procedure. °Let your health care provider know about: °· Any allergies you have. °· All medicines you are taking, including vitamins, herbs, eye drops, creams, and over-the-counter medicines. °· Any use of steroids (by mouth or as a cream). °· Any problems you or family members have had with sedatives and anesthetic medicines. °· Any blood disorders you have. °· Any surgeries you have had. °· Any medical conditions you have, such as sleep apnea. °· Whether you are pregnant or may be pregnant. °· Any use of cigarettes, alcohol, or street drugs. °What are the risks? °Generally, this is a safe procedure. However, problems may occur, including: °· Getting too much medicine (oversedation). °· Nausea. °· Allergic reaction to medicines. °· Trouble breathing. If this happens, a breathing tube may be used to help with breathing. It will be removed when you   are awake and breathing on your own. °· Heart trouble. °· Lung trouble. ° °Before the procedure °Staying hydrated °Follow instructions from your health care provider about hydration, which may include: °· Up to 2 hours before the procedure - you may continue to drink clear liquids, such as water, clear fruit juice, black coffee, and plain tea. ° °Eating and drinking restrictions °Follow instructions from your health care provider about eating and drinking, which may include: °· 8 hours before the procedure - stop eating heavy meals or foods such as meat, fried foods, or fatty foods. °· 6 hours before the procedure - stop eating light meals or foods, such as toast or cereal. °· 6 hours  before the procedure - stop drinking milk or drinks that contain milk. °· 2 hours before the procedure - stop drinking clear liquids. ° °Medicines °Ask your health care provider about: °· Changing or stopping your regular medicines. This is especially important if you are taking diabetes medicines or blood thinners. °· Taking medicines such as aspirin and ibuprofen. These medicines can thin your blood. Do not take these medicines before your procedure if your health care provider instructs you not to. ° °Tests and exams °· You will have a physical exam. °· You may have blood tests done to show: °? How well your kidneys and liver are working. °? How well your blood can clot. ° °General instructions °· Plan to have someone take you home from the hospital or clinic. °· If you will be going home right after the procedure, plan to have someone with you for 24 hours. ° °What happens during the procedure? °· Your blood pressure, heart rate, breathing, level of pain and overall condition will be monitored. °· An IV tube will be inserted into one of your veins. °· Your anesthesia specialist will give you medicines as needed to keep you comfortable during the procedure. This may mean changing the level of sedation. °· The procedure will be performed. °After the procedure °· Your blood pressure, heart rate, breathing rate, and blood oxygen level will be monitored until the medicines you were given have worn off. °· Do not drive for 24 hours if you received a sedative. °· You may: °? Feel sleepy, clumsy, or nauseous. °? Feel forgetful about what happened after the procedure. °? Have a sore throat if you had a breathing tube during the procedure. °? Vomit. °This information is not intended to replace advice given to you by your health care provider. Make sure you discuss any questions you have with your health care provider. °Document Released: 08/09/2005 Document Revised: 04/21/2016 Document Reviewed: 03/05/2016 °Elsevier  Interactive Patient Education © 2018 Elsevier Inc. ° ° °

## 2018-07-15 ENCOUNTER — Encounter (HOSPITAL_COMMUNITY): Payer: Self-pay | Admitting: Ophthalmology

## 2018-07-18 DIAGNOSIS — E1165 Type 2 diabetes mellitus with hyperglycemia: Secondary | ICD-10-CM | POA: Diagnosis not present

## 2018-07-18 DIAGNOSIS — K210 Gastro-esophageal reflux disease with esophagitis: Secondary | ICD-10-CM | POA: Diagnosis not present

## 2018-07-18 DIAGNOSIS — E782 Mixed hyperlipidemia: Secondary | ICD-10-CM | POA: Diagnosis not present

## 2018-07-18 DIAGNOSIS — I10 Essential (primary) hypertension: Secondary | ICD-10-CM | POA: Diagnosis not present

## 2018-07-22 DIAGNOSIS — M5003 Cervical disc disorder with myelopathy, cervicothoracic region: Secondary | ICD-10-CM | POA: Diagnosis not present

## 2018-07-22 DIAGNOSIS — M961 Postlaminectomy syndrome, not elsewhere classified: Secondary | ICD-10-CM | POA: Diagnosis not present

## 2018-07-22 DIAGNOSIS — M50123 Cervical disc disorder at C6-C7 level with radiculopathy: Secondary | ICD-10-CM | POA: Diagnosis not present

## 2018-07-22 DIAGNOSIS — M542 Cervicalgia: Secondary | ICD-10-CM | POA: Diagnosis not present

## 2018-07-24 DIAGNOSIS — Z6826 Body mass index (BMI) 26.0-26.9, adult: Secondary | ICD-10-CM | POA: Diagnosis not present

## 2018-07-24 DIAGNOSIS — Z0001 Encounter for general adult medical examination with abnormal findings: Secondary | ICD-10-CM | POA: Diagnosis not present

## 2018-07-24 DIAGNOSIS — I10 Essential (primary) hypertension: Secondary | ICD-10-CM | POA: Diagnosis not present

## 2018-08-06 ENCOUNTER — Encounter (HOSPITAL_COMMUNITY): Payer: Self-pay | Admitting: Ophthalmology

## 2018-08-06 NOTE — Addendum Note (Signed)
Addendum  created 08/06/18 0956 by Franco Nones, CRNA   Intraprocedure Event deleted, Intraprocedure Event edited, Intraprocedure Staff edited

## 2018-08-26 DIAGNOSIS — M961 Postlaminectomy syndrome, not elsewhere classified: Secondary | ICD-10-CM | POA: Diagnosis not present

## 2018-08-26 DIAGNOSIS — M542 Cervicalgia: Secondary | ICD-10-CM | POA: Diagnosis not present

## 2018-08-26 DIAGNOSIS — M50123 Cervical disc disorder at C6-C7 level with radiculopathy: Secondary | ICD-10-CM | POA: Diagnosis not present

## 2018-08-26 DIAGNOSIS — M5003 Cervical disc disorder with myelopathy, cervicothoracic region: Secondary | ICD-10-CM | POA: Diagnosis not present

## 2018-09-05 DIAGNOSIS — M79622 Pain in left upper arm: Secondary | ICD-10-CM | POA: Diagnosis not present

## 2018-09-05 DIAGNOSIS — M5010 Cervical disc disorder with radiculopathy, unspecified cervical region: Secondary | ICD-10-CM | POA: Diagnosis not present

## 2018-09-05 DIAGNOSIS — G4733 Obstructive sleep apnea (adult) (pediatric): Secondary | ICD-10-CM | POA: Diagnosis not present

## 2018-09-05 DIAGNOSIS — R413 Other amnesia: Secondary | ICD-10-CM | POA: Diagnosis not present

## 2018-09-21 DIAGNOSIS — R51 Headache: Secondary | ICD-10-CM | POA: Diagnosis not present

## 2018-09-21 DIAGNOSIS — R197 Diarrhea, unspecified: Secondary | ICD-10-CM | POA: Diagnosis not present

## 2018-10-03 DIAGNOSIS — M5412 Radiculopathy, cervical region: Secondary | ICD-10-CM | POA: Diagnosis not present

## 2018-10-03 DIAGNOSIS — G5602 Carpal tunnel syndrome, left upper limb: Secondary | ICD-10-CM | POA: Diagnosis not present

## 2018-10-07 DIAGNOSIS — M5003 Cervical disc disorder with myelopathy, cervicothoracic region: Secondary | ICD-10-CM | POA: Diagnosis not present

## 2018-10-07 DIAGNOSIS — M961 Postlaminectomy syndrome, not elsewhere classified: Secondary | ICD-10-CM | POA: Diagnosis not present

## 2018-10-07 DIAGNOSIS — M542 Cervicalgia: Secondary | ICD-10-CM | POA: Diagnosis not present

## 2018-10-07 DIAGNOSIS — M50123 Cervical disc disorder at C6-C7 level with radiculopathy: Secondary | ICD-10-CM | POA: Diagnosis not present

## 2018-11-11 DIAGNOSIS — E782 Mixed hyperlipidemia: Secondary | ICD-10-CM | POA: Diagnosis not present

## 2018-11-11 DIAGNOSIS — E1165 Type 2 diabetes mellitus with hyperglycemia: Secondary | ICD-10-CM | POA: Diagnosis not present

## 2018-11-11 DIAGNOSIS — K210 Gastro-esophageal reflux disease with esophagitis: Secondary | ICD-10-CM | POA: Diagnosis not present

## 2018-11-11 DIAGNOSIS — I10 Essential (primary) hypertension: Secondary | ICD-10-CM | POA: Diagnosis not present

## 2018-11-14 DIAGNOSIS — M542 Cervicalgia: Secondary | ICD-10-CM | POA: Diagnosis not present

## 2018-11-14 DIAGNOSIS — E1165 Type 2 diabetes mellitus with hyperglycemia: Secondary | ICD-10-CM | POA: Diagnosis not present

## 2018-11-14 DIAGNOSIS — G3184 Mild cognitive impairment, so stated: Secondary | ICD-10-CM | POA: Diagnosis not present

## 2018-11-14 DIAGNOSIS — F064 Anxiety disorder due to known physiological condition: Secondary | ICD-10-CM | POA: Diagnosis not present

## 2018-11-14 DIAGNOSIS — Z23 Encounter for immunization: Secondary | ICD-10-CM | POA: Diagnosis not present

## 2018-11-14 DIAGNOSIS — E782 Mixed hyperlipidemia: Secondary | ICD-10-CM | POA: Diagnosis not present

## 2018-11-14 DIAGNOSIS — M25512 Pain in left shoulder: Secondary | ICD-10-CM | POA: Diagnosis not present

## 2018-12-02 DIAGNOSIS — M50123 Cervical disc disorder at C6-C7 level with radiculopathy: Secondary | ICD-10-CM | POA: Diagnosis not present

## 2018-12-02 DIAGNOSIS — M5003 Cervical disc disorder with myelopathy, cervicothoracic region: Secondary | ICD-10-CM | POA: Diagnosis not present

## 2018-12-02 DIAGNOSIS — M542 Cervicalgia: Secondary | ICD-10-CM | POA: Diagnosis not present

## 2018-12-02 DIAGNOSIS — M961 Postlaminectomy syndrome, not elsewhere classified: Secondary | ICD-10-CM | POA: Diagnosis not present

## 2019-01-09 DIAGNOSIS — F329 Major depressive disorder, single episode, unspecified: Secondary | ICD-10-CM | POA: Diagnosis not present

## 2019-01-09 DIAGNOSIS — R4189 Other symptoms and signs involving cognitive functions and awareness: Secondary | ICD-10-CM | POA: Diagnosis not present

## 2019-01-09 DIAGNOSIS — F4310 Post-traumatic stress disorder, unspecified: Secondary | ICD-10-CM | POA: Diagnosis not present

## 2019-01-09 DIAGNOSIS — F419 Anxiety disorder, unspecified: Secondary | ICD-10-CM | POA: Diagnosis not present

## 2019-01-09 DIAGNOSIS — E538 Deficiency of other specified B group vitamins: Secondary | ICD-10-CM | POA: Diagnosis not present

## 2019-01-09 DIAGNOSIS — E559 Vitamin D deficiency, unspecified: Secondary | ICD-10-CM | POA: Diagnosis not present

## 2019-01-09 DIAGNOSIS — Z79899 Other long term (current) drug therapy: Secondary | ICD-10-CM | POA: Diagnosis not present

## 2019-01-09 DIAGNOSIS — G894 Chronic pain syndrome: Secondary | ICD-10-CM | POA: Diagnosis not present

## 2019-01-27 DIAGNOSIS — M542 Cervicalgia: Secondary | ICD-10-CM | POA: Diagnosis not present

## 2019-01-27 DIAGNOSIS — M5003 Cervical disc disorder with myelopathy, cervicothoracic region: Secondary | ICD-10-CM | POA: Diagnosis not present

## 2019-01-27 DIAGNOSIS — M50123 Cervical disc disorder at C6-C7 level with radiculopathy: Secondary | ICD-10-CM | POA: Diagnosis not present

## 2019-01-27 DIAGNOSIS — M961 Postlaminectomy syndrome, not elsewhere classified: Secondary | ICD-10-CM | POA: Diagnosis not present

## 2019-02-10 ENCOUNTER — Ambulatory Visit (HOSPITAL_COMMUNITY): Payer: Medicare Other | Attending: Neurology | Admitting: Speech Pathology

## 2019-02-10 ENCOUNTER — Encounter (HOSPITAL_COMMUNITY): Payer: Self-pay | Admitting: Speech Pathology

## 2019-02-10 ENCOUNTER — Other Ambulatory Visit: Payer: Self-pay

## 2019-02-10 ENCOUNTER — Ambulatory Visit: Payer: Medicare Other | Admitting: Speech Pathology

## 2019-02-10 DIAGNOSIS — R41841 Cognitive communication deficit: Secondary | ICD-10-CM | POA: Diagnosis not present

## 2019-02-10 NOTE — Therapy (Signed)
[facility] [address] [place], [place], [address] Phone: [phone]   Fax:  [phone]  Speech Language Pathology Evaluation  Patient Details  Name: Derek Hunter MRN: [id number] Date of Birth: 1956/05/20 Referring Provider (SLP): Lonell Face, MD   Encounter Date: 02/10/2019  End of Session - 02/10/19 1805    Visit Number  1    Number of Visits  3    Date for SLP Re-Evaluation  03/13/19    Authorization Type  Medicare/Medicaid    SLP Start Time  1430    SLP Stop Time   1530    SLP Time Calculation (min)  60 min    Activity Tolerance  Patient tolerated treatment well       Past Medical History:  Diagnosis Date  . Achalasia of esophagus t-5  . Anxiety   . Arthritis   . Cervical stenosis of spinal canal   . Depression   . Diabetes mellitus   . GERD (gastroesophageal reflux disease)   . Hypertension 03/12/2017  . Memory loss     Past Surgical History:  Procedure Laterality Date  . ANTERIOR CERVICAL DECOMP/DISCECTOMY FUSION N/A 03/15/2017   Procedure: Anterior Cervical Discectomy Fusion - Cervical five- Cervical six - Cervical six- Cervical seven;  Surgeon: Julio Sicks, MD;  Location: [facility] OR;  Service: Neurosurgery;  Laterality: N/A;  . APPENDECTOMY    . CATARACT EXTRACTION W/PHACO Right 07/12/2018   Procedure: CATARACT EXTRACTION PHACO AND INTRAOCULAR LENS PLACEMENT (IOC);  Surgeon: Fabio Pierce, MD;  Location: [facility] ORS;  Service: Ophthalmology;  Laterality: Right;  CDE:  8.85  . CATARACT EXTRACTION W/PHACO Left 06/28/2018   Procedure: CATARACT EXTRACTION PHACO AND INTRAOCULAR LENS PLACEMENT (IOC);  Surgeon: Fabio Pierce, MD;  Location: [facility] ORS;  Service: Ophthalmology;  Laterality: Left;  CDE: 4.25  . DIAGNOSTIC LAPAROSCOPY     " flap to keep esophagus open"  . HERNIA REPAIR      There were no vitals filed for this visit.  Subjective Assessment - 02/10/19 1756    Subjective  "He has had trouble with his memory for seven  years." -Derek Hunter (Pt's daughter)    Patient is accompained by:  Family member    Special Tests  MoCA    Currently in Pain?  No/denies         SLP Evaluation [facility] - 02/10/19 1756      SLP Visit Information   SLP Received On  02/10/19    Referring Provider (SLP)  Clydia Llano Marylou Mccoy, MD    Onset Date  --   ~ 7 years ago   Medical Diagnosis  cognitive impairment      Subjective   Subjective  Memory    Patient/Family Stated Goal  Improve memory      General Information   HPI  [name] is a 63 yo male who was referred by Dr. Cristopher Peru for a cognitive linguistic evaluation and treatment. 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   No    Is the patient reluctant to leave their home because of a fear of falling?   No      Prior Functional Status   Cognitive/Linguistic Baseline  Baseline deficits    Baseline deficit details  7 year history of memory deficits    Type of Home  House     Lives With  Alone    Available Support  Family    Vocation  On disability      Cognition   Overall Cognitive Status  History of cognitive impairments - at baseline    Attention  Sustained    Sustained Attention  Impaired    Sustained Attention Impairment  Verbal basic    Memory  Impaired    Memory Impairment  Storage deficit;Retrieval deficit;Decreased recall of new information    Awareness  Impaired     Awareness Impairment  Intellectual impairment    Problem Solving  Impaired    Problem Solving Impairment  Verbal complex;Functional complex    Hospital doctor Monitoring;Self Correcting    Behaviors  Poor frustration tolerance      Auditory Comprehension   Overall Auditory Comprehension  Impaired    Yes/No Questions  Within Functional Limits    Commands  Impaired    Multistep Basic Commands  50-74% accurate    Conversation  Moderately complex    Interfering Components  Attention;Working Marketing executive  Within [employer]      Reading Comprehension   Reading Status  Not tested      Expression   Primary Mode of Expression  Verbal      Verbal Expression   Overall Verbal Expression  Impaired    Initiation  No impairment    Automatic Speech  Name;Social Response    Level of Generative/Spontaneous Verbalization  Conversation    Repetition  Impaired    Level of Impairment  Sentence level    Naming  Impairment    Responsive  76-100% accurate    Confrontation  75-100% accurate    Convergent  25-49% accurate    Divergent  0-24% accurate    Pragmatics  No impairment    Non-Verbal Means of Communication  Not applicable      Written Expression   Dominant Hand  Right    Written Expression  Exceptions to [facility]    Copy Ability  --   figures   Self Formulation Ability  Word      Oral Motor/Sensory Function   Overall Oral Motor/Sensory Function  Appears within functional limits for tasks assessed      Motor Speech   Overall Motor Speech  Appears within functional limits for tasks assessed    Respiration  Within functional limits    Phonation  Normal    Resonance  Within functional limits    Articulation  Within functional limitis    Intelligibility  Intelligible    Motor Planning  Witnin functional limits    Motor Speech Errors  Not  applicable    Phonation  WFL      Standardized Assessments   Standardized Assessments   Montreal Cognitive Assessment (MOCA)    Montreal Cognitive Assessment (MOCA)   9/30  SLP Short Term Goals - 02/10/19 1807      SLP SHORT TERM GOAL #1   Title  Pt will implement memory strategies (written to do lists, routines, etc) with mod cues during functional tasks in session for 80% acc.   Baseline  Not using strategies yet   Time  3    Period  Weeks    Status  New    Target Date  03/13/19      SLP SHORT TERM GOAL #2   Title  Pt/caregiver will identify appropriate strategies to assist Pt in hypothetical problem solving scenarios with mod assist from SLP.    Baseline  N/A intro   Time  3    Period  Weeks    Status  New    Target Date  03/13/19      SLP SHORT TERM GOAL #3   Title  Pending   Baseline     Time  3    Period  Weeks    Status  New    Target Date  03/13/19       SLP Long Term Goals - 02/10/19 1808      SLP LONG TERM GOAL #1   Title  Same as short term       Plan - 02/10/19 1806    Clinical Impression Statement Pt presents with moderate/severe cognitive deficits characterized by impairments in working memory, executive function skills, attention, and orientation which apparently has been an issue for the last seven years per family. Pt has been living at home alone and driving. He is a retired Curator. He typically was picking up his grandchildren from elementary school and taking them home. His daughter, Derek Hunter, is an Charity fundraiser and assists with medication management (insulin shots) and finances. He appears well groomed and maintains fairly well at home with a consistent routine (watches [place], does his laundry, and simple meal preparation). He has needed some reminders for occasionally forgetting to pick up the grandchildren. He telephoned his family the other day when he was attempting to bake a pizza in the oven and family called him back in twenty minutes to  remove from the oven. Pt scored a 9/30 on the MoCA version 7.3 indicating severe impairments. Pt's mother apparently had early onset dementia. Unfortunately, Pt is not a good candidate for long term cognitive linguistic therapy given the progressive and long-standing nature of his cognitive deficits. He would be appropriate for short term therapy to focus on pt/caregiver education to help establish good routines at home. Of greatest concern for safety would be driving and cooking without supervision given severity of memory deficits. Pt/family indicated that he has a phone, but does not use if for reminders and would not be amendable to using it as such. Recommend short term SLP therapy as stated above to assist in establishing routines at home. At the very least, he would benefit from supervision at home a few hours a day so that he is not using the stove etc.    Speech Therapy Frequency  1x /week    Duration  --   3 weeks   Treatment/Interventions  Cognitive reorganization;Compensatory strategies;Compensatory techniques;Cueing hierarchy;Internal/external aids;SLP instruction and feedback;Patient/family education    Potential to Achieve Goals  Fair    Potential Considerations  Ability to learn/carryover information;Severity of impairments;Previous level of function    Consulted and Agree with Plan of Care  Patient;Family member/caregiver    Family Member Consulted  daughter       Patient will  benefit from skilled therapeutic intervention in order to improve the following deficits and impairments:   Cognitive communication deficit    Problem List Patient Active Problem List   Diagnosis Date Noted  . Paroxysmal tachycardia (HCC) 03/13/2017  . Hyperlipidemia 03/13/2017  . Hypertension 03/12/2017   Thank you,  Havery Moros, CCC-SLP [phone]  Havery Moros 02/10/2019, 6:08 PM  [facility] [address] [place], [place], [address] Phone:  [phone]   Fax:  [phone]  Name: Derek Hunter MRN: [id number] Date of Birth: 09-Mar-1956

## 2019-02-12 DIAGNOSIS — R4189 Other symptoms and signs involving cognitive functions and awareness: Secondary | ICD-10-CM | POA: Diagnosis not present

## 2019-02-12 DIAGNOSIS — Z79899 Other long term (current) drug therapy: Secondary | ICD-10-CM | POA: Diagnosis not present

## 2019-02-12 DIAGNOSIS — G3184 Mild cognitive impairment, so stated: Secondary | ICD-10-CM | POA: Diagnosis not present

## 2019-02-25 DIAGNOSIS — R4189 Other symptoms and signs involving cognitive functions and awareness: Secondary | ICD-10-CM | POA: Diagnosis not present

## 2019-03-05 DIAGNOSIS — K210 Gastro-esophageal reflux disease with esophagitis: Secondary | ICD-10-CM | POA: Diagnosis not present

## 2019-03-05 DIAGNOSIS — E782 Mixed hyperlipidemia: Secondary | ICD-10-CM | POA: Diagnosis not present

## 2019-03-05 DIAGNOSIS — I10 Essential (primary) hypertension: Secondary | ICD-10-CM | POA: Diagnosis not present

## 2019-03-05 DIAGNOSIS — E1165 Type 2 diabetes mellitus with hyperglycemia: Secondary | ICD-10-CM | POA: Diagnosis not present

## 2019-03-11 DIAGNOSIS — F324 Major depressive disorder, single episode, in partial remission: Secondary | ICD-10-CM | POA: Diagnosis not present

## 2019-03-11 DIAGNOSIS — E1165 Type 2 diabetes mellitus with hyperglycemia: Secondary | ICD-10-CM | POA: Diagnosis not present

## 2019-03-11 DIAGNOSIS — K210 Gastro-esophageal reflux disease with esophagitis: Secondary | ICD-10-CM | POA: Diagnosis not present

## 2019-03-11 DIAGNOSIS — M542 Cervicalgia: Secondary | ICD-10-CM | POA: Diagnosis not present

## 2019-03-11 DIAGNOSIS — G3184 Mild cognitive impairment, so stated: Secondary | ICD-10-CM | POA: Diagnosis not present

## 2019-03-11 DIAGNOSIS — E782 Mixed hyperlipidemia: Secondary | ICD-10-CM | POA: Diagnosis not present

## 2019-03-11 DIAGNOSIS — F064 Anxiety disorder due to known physiological condition: Secondary | ICD-10-CM | POA: Diagnosis not present

## 2019-03-13 DIAGNOSIS — R109 Unspecified abdominal pain: Secondary | ICD-10-CM | POA: Diagnosis not present

## 2019-03-17 DIAGNOSIS — M5003 Cervical disc disorder with myelopathy, cervicothoracic region: Secondary | ICD-10-CM | POA: Diagnosis not present

## 2019-03-17 DIAGNOSIS — M961 Postlaminectomy syndrome, not elsewhere classified: Secondary | ICD-10-CM | POA: Diagnosis not present

## 2019-03-17 DIAGNOSIS — M542 Cervicalgia: Secondary | ICD-10-CM | POA: Diagnosis not present

## 2019-03-17 DIAGNOSIS — M50123 Cervical disc disorder at C6-C7 level with radiculopathy: Secondary | ICD-10-CM | POA: Diagnosis not present

## 2019-04-01 DIAGNOSIS — L02611 Cutaneous abscess of right foot: Secondary | ICD-10-CM | POA: Diagnosis not present

## 2019-04-25 DIAGNOSIS — R509 Fever, unspecified: Secondary | ICD-10-CM | POA: Diagnosis not present

## 2019-04-25 DIAGNOSIS — W57XXXA Bitten or stung by nonvenomous insect and other nonvenomous arthropods, initial encounter: Secondary | ICD-10-CM | POA: Diagnosis not present

## 2019-05-05 DIAGNOSIS — M50123 Cervical disc disorder at C6-C7 level with radiculopathy: Secondary | ICD-10-CM | POA: Diagnosis not present

## 2019-05-05 DIAGNOSIS — M961 Postlaminectomy syndrome, not elsewhere classified: Secondary | ICD-10-CM | POA: Diagnosis not present

## 2019-05-05 DIAGNOSIS — M542 Cervicalgia: Secondary | ICD-10-CM | POA: Diagnosis not present

## 2019-05-05 DIAGNOSIS — M5003 Cervical disc disorder with myelopathy, cervicothoracic region: Secondary | ICD-10-CM | POA: Diagnosis not present

## 2019-05-26 DIAGNOSIS — M79672 Pain in left foot: Secondary | ICD-10-CM | POA: Diagnosis not present

## 2019-05-26 DIAGNOSIS — M25572 Pain in left ankle and joints of left foot: Secondary | ICD-10-CM | POA: Diagnosis not present

## 2019-06-12 DIAGNOSIS — L739 Follicular disorder, unspecified: Secondary | ICD-10-CM | POA: Diagnosis not present

## 2019-06-12 DIAGNOSIS — B353 Tinea pedis: Secondary | ICD-10-CM | POA: Diagnosis not present

## 2019-06-25 DIAGNOSIS — E1165 Type 2 diabetes mellitus with hyperglycemia: Secondary | ICD-10-CM | POA: Diagnosis not present

## 2019-06-25 DIAGNOSIS — I10 Essential (primary) hypertension: Secondary | ICD-10-CM | POA: Diagnosis not present

## 2019-06-25 DIAGNOSIS — E782 Mixed hyperlipidemia: Secondary | ICD-10-CM | POA: Diagnosis not present

## 2019-06-25 DIAGNOSIS — K210 Gastro-esophageal reflux disease with esophagitis: Secondary | ICD-10-CM | POA: Diagnosis not present

## 2019-07-23 DIAGNOSIS — Z6827 Body mass index (BMI) 27.0-27.9, adult: Secondary | ICD-10-CM | POA: Diagnosis not present

## 2019-07-23 DIAGNOSIS — F324 Major depressive disorder, single episode, in partial remission: Secondary | ICD-10-CM | POA: Diagnosis not present

## 2019-07-23 DIAGNOSIS — E1165 Type 2 diabetes mellitus with hyperglycemia: Secondary | ICD-10-CM | POA: Diagnosis not present

## 2019-07-23 DIAGNOSIS — K210 Gastro-esophageal reflux disease with esophagitis: Secondary | ICD-10-CM | POA: Diagnosis not present

## 2019-07-23 DIAGNOSIS — M542 Cervicalgia: Secondary | ICD-10-CM | POA: Diagnosis not present

## 2019-07-23 DIAGNOSIS — E782 Mixed hyperlipidemia: Secondary | ICD-10-CM | POA: Diagnosis not present

## 2019-07-23 DIAGNOSIS — G3184 Mild cognitive impairment, so stated: Secondary | ICD-10-CM | POA: Diagnosis not present

## 2019-07-23 DIAGNOSIS — F064 Anxiety disorder due to known physiological condition: Secondary | ICD-10-CM | POA: Diagnosis not present

## 2019-08-15 DIAGNOSIS — R4189 Other symptoms and signs involving cognitive functions and awareness: Secondary | ICD-10-CM | POA: Diagnosis not present

## 2019-08-25 DIAGNOSIS — M50123 Cervical disc disorder at C6-C7 level with radiculopathy: Secondary | ICD-10-CM | POA: Diagnosis not present

## 2019-08-25 DIAGNOSIS — M542 Cervicalgia: Secondary | ICD-10-CM | POA: Diagnosis not present

## 2019-08-25 DIAGNOSIS — M5003 Cervical disc disorder with myelopathy, cervicothoracic region: Secondary | ICD-10-CM | POA: Diagnosis not present

## 2019-08-25 DIAGNOSIS — M961 Postlaminectomy syndrome, not elsewhere classified: Secondary | ICD-10-CM | POA: Diagnosis not present

## 2019-09-22 DIAGNOSIS — M50123 Cervical disc disorder at C6-C7 level with radiculopathy: Secondary | ICD-10-CM | POA: Diagnosis not present

## 2019-09-22 DIAGNOSIS — M961 Postlaminectomy syndrome, not elsewhere classified: Secondary | ICD-10-CM | POA: Diagnosis not present

## 2019-09-22 DIAGNOSIS — M5003 Cervical disc disorder with myelopathy, cervicothoracic region: Secondary | ICD-10-CM | POA: Diagnosis not present

## 2019-09-22 DIAGNOSIS — M542 Cervicalgia: Secondary | ICD-10-CM | POA: Diagnosis not present

## 2019-10-08 DIAGNOSIS — F411 Generalized anxiety disorder: Secondary | ICD-10-CM | POA: Diagnosis not present

## 2019-10-08 DIAGNOSIS — F410 Panic disorder [episodic paroxysmal anxiety] without agoraphobia: Secondary | ICD-10-CM | POA: Diagnosis not present

## 2019-10-27 DIAGNOSIS — F411 Generalized anxiety disorder: Secondary | ICD-10-CM | POA: Diagnosis not present

## 2019-10-27 DIAGNOSIS — E1165 Type 2 diabetes mellitus with hyperglycemia: Secondary | ICD-10-CM | POA: Diagnosis not present

## 2019-11-03 DIAGNOSIS — M542 Cervicalgia: Secondary | ICD-10-CM | POA: Diagnosis not present

## 2019-11-03 DIAGNOSIS — M961 Postlaminectomy syndrome, not elsewhere classified: Secondary | ICD-10-CM | POA: Diagnosis not present

## 2019-11-03 DIAGNOSIS — M50123 Cervical disc disorder at C6-C7 level with radiculopathy: Secondary | ICD-10-CM | POA: Diagnosis not present

## 2019-11-03 DIAGNOSIS — M5003 Cervical disc disorder with myelopathy, cervicothoracic region: Secondary | ICD-10-CM | POA: Diagnosis not present

## 2019-11-26 DIAGNOSIS — J069 Acute upper respiratory infection, unspecified: Secondary | ICD-10-CM | POA: Diagnosis not present

## 2019-11-26 DIAGNOSIS — Z20828 Contact with and (suspected) exposure to other viral communicable diseases: Secondary | ICD-10-CM | POA: Diagnosis not present

## 2019-12-11 DIAGNOSIS — K2100 Gastro-esophageal reflux disease with esophagitis, without bleeding: Secondary | ICD-10-CM | POA: Diagnosis not present

## 2019-12-11 DIAGNOSIS — E1165 Type 2 diabetes mellitus with hyperglycemia: Secondary | ICD-10-CM | POA: Diagnosis not present

## 2019-12-11 DIAGNOSIS — E782 Mixed hyperlipidemia: Secondary | ICD-10-CM | POA: Diagnosis not present

## 2019-12-11 DIAGNOSIS — I10 Essential (primary) hypertension: Secondary | ICD-10-CM | POA: Diagnosis not present

## 2019-12-15 DIAGNOSIS — E1165 Type 2 diabetes mellitus with hyperglycemia: Secondary | ICD-10-CM | POA: Diagnosis not present

## 2019-12-15 DIAGNOSIS — E782 Mixed hyperlipidemia: Secondary | ICD-10-CM | POA: Diagnosis not present

## 2019-12-15 DIAGNOSIS — F419 Anxiety disorder, unspecified: Secondary | ICD-10-CM | POA: Diagnosis not present

## 2019-12-15 DIAGNOSIS — F324 Major depressive disorder, single episode, in partial remission: Secondary | ICD-10-CM | POA: Diagnosis not present

## 2019-12-15 DIAGNOSIS — F329 Major depressive disorder, single episode, unspecified: Secondary | ICD-10-CM | POA: Diagnosis not present

## 2019-12-15 DIAGNOSIS — K2100 Gastro-esophageal reflux disease with esophagitis, without bleeding: Secondary | ICD-10-CM | POA: Diagnosis not present

## 2019-12-15 DIAGNOSIS — F064 Anxiety disorder due to known physiological condition: Secondary | ICD-10-CM | POA: Diagnosis not present

## 2019-12-15 DIAGNOSIS — G3184 Mild cognitive impairment, so stated: Secondary | ICD-10-CM | POA: Diagnosis not present

## 2019-12-15 DIAGNOSIS — Z79899 Other long term (current) drug therapy: Secondary | ICD-10-CM | POA: Diagnosis not present

## 2019-12-15 DIAGNOSIS — Z23 Encounter for immunization: Secondary | ICD-10-CM | POA: Diagnosis not present

## 2019-12-15 DIAGNOSIS — R4189 Other symptoms and signs involving cognitive functions and awareness: Secondary | ICD-10-CM | POA: Diagnosis not present

## 2019-12-22 DIAGNOSIS — F0390 Unspecified dementia without behavioral disturbance: Secondary | ICD-10-CM | POA: Diagnosis not present

## 2019-12-22 DIAGNOSIS — N419 Inflammatory disease of prostate, unspecified: Secondary | ICD-10-CM | POA: Diagnosis not present

## 2019-12-22 DIAGNOSIS — E1165 Type 2 diabetes mellitus with hyperglycemia: Secondary | ICD-10-CM | POA: Diagnosis not present

## 2019-12-22 DIAGNOSIS — F064 Anxiety disorder due to known physiological condition: Secondary | ICD-10-CM | POA: Diagnosis not present

## 2019-12-22 DIAGNOSIS — R410 Disorientation, unspecified: Secondary | ICD-10-CM | POA: Diagnosis not present

## 2019-12-31 DIAGNOSIS — F0390 Unspecified dementia without behavioral disturbance: Secondary | ICD-10-CM | POA: Diagnosis not present

## 2019-12-31 DIAGNOSIS — R410 Disorientation, unspecified: Secondary | ICD-10-CM | POA: Diagnosis not present

## 2020-01-10 DIAGNOSIS — E7800 Pure hypercholesterolemia, unspecified: Secondary | ICD-10-CM | POA: Diagnosis not present

## 2020-01-10 DIAGNOSIS — E119 Type 2 diabetes mellitus without complications: Secondary | ICD-10-CM | POA: Diagnosis not present

## 2020-01-10 DIAGNOSIS — Z87891 Personal history of nicotine dependence: Secondary | ICD-10-CM | POA: Diagnosis not present

## 2020-01-10 DIAGNOSIS — Z880 Allergy status to penicillin: Secondary | ICD-10-CM | POA: Diagnosis not present

## 2020-01-10 DIAGNOSIS — Z20822 Contact with and (suspected) exposure to covid-19: Secondary | ICD-10-CM | POA: Diagnosis not present

## 2020-01-10 DIAGNOSIS — L22 Diaper dermatitis: Secondary | ICD-10-CM | POA: Diagnosis not present

## 2020-01-10 DIAGNOSIS — Z79899 Other long term (current) drug therapy: Secondary | ICD-10-CM | POA: Diagnosis not present

## 2020-01-10 DIAGNOSIS — R000 Tachycardia, unspecified: Secondary | ICD-10-CM | POA: Diagnosis not present

## 2020-01-10 DIAGNOSIS — F0391 Unspecified dementia with behavioral disturbance: Secondary | ICD-10-CM | POA: Diagnosis not present

## 2020-01-10 DIAGNOSIS — Z7984 Long term (current) use of oral hypoglycemic drugs: Secondary | ICD-10-CM | POA: Diagnosis not present

## 2020-01-10 DIAGNOSIS — Z7982 Long term (current) use of aspirin: Secondary | ICD-10-CM | POA: Diagnosis not present

## 2020-01-10 DIAGNOSIS — F0390 Unspecified dementia without behavioral disturbance: Secondary | ICD-10-CM | POA: Diagnosis not present

## 2020-01-10 DIAGNOSIS — F419 Anxiety disorder, unspecified: Secondary | ICD-10-CM | POA: Diagnosis not present

## 2020-01-10 DIAGNOSIS — R410 Disorientation, unspecified: Secondary | ICD-10-CM | POA: Diagnosis not present

## 2020-01-28 DIAGNOSIS — R4182 Altered mental status, unspecified: Secondary | ICD-10-CM | POA: Diagnosis not present

## 2020-01-28 DIAGNOSIS — Z131 Encounter for screening for diabetes mellitus: Secondary | ICD-10-CM | POA: Diagnosis not present

## 2020-01-28 DIAGNOSIS — Z049 Encounter for examination and observation for unspecified reason: Secondary | ICD-10-CM | POA: Diagnosis not present

## 2020-01-28 DIAGNOSIS — Z794 Long term (current) use of insulin: Secondary | ICD-10-CM | POA: Diagnosis not present

## 2020-01-28 DIAGNOSIS — E785 Hyperlipidemia, unspecified: Secondary | ICD-10-CM | POA: Diagnosis not present

## 2020-01-28 DIAGNOSIS — Z5181 Encounter for therapeutic drug level monitoring: Secondary | ICD-10-CM | POA: Diagnosis not present

## 2020-01-28 DIAGNOSIS — L22 Diaper dermatitis: Secondary | ICD-10-CM | POA: Diagnosis not present

## 2020-01-28 DIAGNOSIS — R7989 Other specified abnormal findings of blood chemistry: Secondary | ICD-10-CM | POA: Diagnosis not present

## 2020-01-28 DIAGNOSIS — G8929 Other chronic pain: Secondary | ICD-10-CM | POA: Diagnosis not present

## 2020-01-28 DIAGNOSIS — E559 Vitamin D deficiency, unspecified: Secondary | ICD-10-CM | POA: Diagnosis not present

## 2020-01-28 DIAGNOSIS — Z20822 Contact with and (suspected) exposure to covid-19: Secondary | ICD-10-CM | POA: Diagnosis not present

## 2020-01-28 DIAGNOSIS — D510 Vitamin B12 deficiency anemia due to intrinsic factor deficiency: Secondary | ICD-10-CM | POA: Diagnosis not present

## 2020-01-28 DIAGNOSIS — I10 Essential (primary) hypertension: Secondary | ICD-10-CM | POA: Diagnosis not present

## 2020-01-28 DIAGNOSIS — E1065 Type 1 diabetes mellitus with hyperglycemia: Secondary | ICD-10-CM | POA: Diagnosis not present

## 2020-01-28 DIAGNOSIS — F23 Brief psychotic disorder: Secondary | ICD-10-CM | POA: Diagnosis not present

## 2020-01-28 DIAGNOSIS — R8290 Unspecified abnormal findings in urine: Secondary | ICD-10-CM | POA: Diagnosis not present

## 2020-01-28 DIAGNOSIS — N390 Urinary tract infection, site not specified: Secondary | ICD-10-CM | POA: Diagnosis not present

## 2020-01-28 DIAGNOSIS — A488 Other specified bacterial diseases: Secondary | ICD-10-CM | POA: Diagnosis not present

## 2020-01-28 DIAGNOSIS — Z79899 Other long term (current) drug therapy: Secondary | ICD-10-CM | POA: Diagnosis not present

## 2020-01-28 DIAGNOSIS — Z13220 Encounter for screening for lipoid disorders: Secondary | ICD-10-CM | POA: Diagnosis not present

## 2020-01-28 DIAGNOSIS — F419 Anxiety disorder, unspecified: Secondary | ICD-10-CM | POA: Diagnosis not present

## 2020-01-28 DIAGNOSIS — R451 Restlessness and agitation: Secondary | ICD-10-CM | POA: Diagnosis not present

## 2020-01-28 DIAGNOSIS — F0391 Unspecified dementia with behavioral disturbance: Secondary | ICD-10-CM | POA: Diagnosis not present

## 2020-01-28 DIAGNOSIS — R410 Disorientation, unspecified: Secondary | ICD-10-CM | POA: Diagnosis not present

## 2020-01-28 DIAGNOSIS — M62838 Other muscle spasm: Secondary | ICD-10-CM | POA: Diagnosis not present

## 2020-01-28 DIAGNOSIS — Z113 Encounter for screening for infections with a predominantly sexual mode of transmission: Secondary | ICD-10-CM | POA: Diagnosis not present

## 2020-01-28 DIAGNOSIS — R946 Abnormal results of thyroid function studies: Secondary | ICD-10-CM | POA: Diagnosis not present

## 2020-01-28 DIAGNOSIS — E1165 Type 2 diabetes mellitus with hyperglycemia: Secondary | ICD-10-CM | POA: Diagnosis not present

## 2020-01-28 DIAGNOSIS — Z130 Encounter for screening for diseases of the blood and blood-forming organs and certain disorders involving the immune mechanism: Secondary | ICD-10-CM | POA: Diagnosis not present

## 2020-01-28 DIAGNOSIS — Z13228 Encounter for screening for other metabolic disorders: Secondary | ICD-10-CM | POA: Diagnosis not present

## 2020-01-28 DIAGNOSIS — Z112 Encounter for screening for other bacterial diseases: Secondary | ICD-10-CM | POA: Diagnosis not present

## 2020-01-28 DIAGNOSIS — R000 Tachycardia, unspecified: Secondary | ICD-10-CM | POA: Diagnosis not present

## 2020-01-28 DIAGNOSIS — Z1389 Encounter for screening for other disorder: Secondary | ICD-10-CM | POA: Diagnosis not present

## 2020-01-28 DIAGNOSIS — Z712 Person consulting for explanation of examination or test findings: Secondary | ICD-10-CM | POA: Diagnosis not present

## 2020-01-28 DIAGNOSIS — R5383 Other fatigue: Secondary | ICD-10-CM | POA: Diagnosis not present

## 2020-01-28 DIAGNOSIS — E119 Type 2 diabetes mellitus without complications: Secondary | ICD-10-CM | POA: Diagnosis not present

## 2020-01-28 DIAGNOSIS — E1121 Type 2 diabetes mellitus with diabetic nephropathy: Secondary | ICD-10-CM | POA: Diagnosis not present

## 2020-01-28 DIAGNOSIS — Z1329 Encounter for screening for other suspected endocrine disorder: Secondary | ICD-10-CM | POA: Diagnosis not present

## 2020-01-28 DIAGNOSIS — R7309 Other abnormal glucose: Secondary | ICD-10-CM | POA: Diagnosis not present

## 2020-02-17 DIAGNOSIS — R278 Other lack of coordination: Secondary | ICD-10-CM | POA: Diagnosis not present

## 2020-02-17 DIAGNOSIS — F0281 Dementia in other diseases classified elsewhere with behavioral disturbance: Secondary | ICD-10-CM | POA: Diagnosis not present

## 2020-02-17 DIAGNOSIS — M6281 Muscle weakness (generalized): Secondary | ICD-10-CM | POA: Diagnosis not present

## 2020-02-17 DIAGNOSIS — R41841 Cognitive communication deficit: Secondary | ICD-10-CM | POA: Diagnosis not present

## 2020-02-18 DIAGNOSIS — F0281 Dementia in other diseases classified elsewhere with behavioral disturbance: Secondary | ICD-10-CM | POA: Diagnosis not present

## 2020-02-18 DIAGNOSIS — M6281 Muscle weakness (generalized): Secondary | ICD-10-CM | POA: Diagnosis not present

## 2020-02-18 DIAGNOSIS — R278 Other lack of coordination: Secondary | ICD-10-CM | POA: Diagnosis not present

## 2020-02-18 DIAGNOSIS — R41841 Cognitive communication deficit: Secondary | ICD-10-CM | POA: Diagnosis not present

## 2020-02-19 DIAGNOSIS — F0390 Unspecified dementia without behavioral disturbance: Secondary | ICD-10-CM | POA: Diagnosis not present

## 2020-02-19 DIAGNOSIS — K219 Gastro-esophageal reflux disease without esophagitis: Secondary | ICD-10-CM | POA: Diagnosis not present

## 2020-02-19 DIAGNOSIS — M6281 Muscle weakness (generalized): Secondary | ICD-10-CM | POA: Diagnosis not present

## 2020-02-19 DIAGNOSIS — R278 Other lack of coordination: Secondary | ICD-10-CM | POA: Diagnosis not present

## 2020-02-19 DIAGNOSIS — E119 Type 2 diabetes mellitus without complications: Secondary | ICD-10-CM | POA: Diagnosis not present

## 2020-02-19 DIAGNOSIS — I10 Essential (primary) hypertension: Secondary | ICD-10-CM | POA: Diagnosis not present

## 2020-02-19 DIAGNOSIS — F0281 Dementia in other diseases classified elsewhere with behavioral disturbance: Secondary | ICD-10-CM | POA: Diagnosis not present

## 2020-02-19 DIAGNOSIS — R41841 Cognitive communication deficit: Secondary | ICD-10-CM | POA: Diagnosis not present

## 2020-02-20 DIAGNOSIS — F0281 Dementia in other diseases classified elsewhere with behavioral disturbance: Secondary | ICD-10-CM | POA: Diagnosis not present

## 2020-02-20 DIAGNOSIS — M6281 Muscle weakness (generalized): Secondary | ICD-10-CM | POA: Diagnosis not present

## 2020-02-20 DIAGNOSIS — R278 Other lack of coordination: Secondary | ICD-10-CM | POA: Diagnosis not present

## 2020-02-20 DIAGNOSIS — R41841 Cognitive communication deficit: Secondary | ICD-10-CM | POA: Diagnosis not present

## 2020-02-23 DIAGNOSIS — R278 Other lack of coordination: Secondary | ICD-10-CM | POA: Diagnosis not present

## 2020-02-23 DIAGNOSIS — R41841 Cognitive communication deficit: Secondary | ICD-10-CM | POA: Diagnosis not present

## 2020-02-23 DIAGNOSIS — F0281 Dementia in other diseases classified elsewhere with behavioral disturbance: Secondary | ICD-10-CM | POA: Diagnosis not present

## 2020-02-23 DIAGNOSIS — M6281 Muscle weakness (generalized): Secondary | ICD-10-CM | POA: Diagnosis not present

## 2020-02-24 DIAGNOSIS — R41841 Cognitive communication deficit: Secondary | ICD-10-CM | POA: Diagnosis not present

## 2020-02-24 DIAGNOSIS — R278 Other lack of coordination: Secondary | ICD-10-CM | POA: Diagnosis not present

## 2020-02-24 DIAGNOSIS — E119 Type 2 diabetes mellitus without complications: Secondary | ICD-10-CM | POA: Diagnosis not present

## 2020-02-24 DIAGNOSIS — F0281 Dementia in other diseases classified elsewhere with behavioral disturbance: Secondary | ICD-10-CM | POA: Diagnosis not present

## 2020-02-24 DIAGNOSIS — E782 Mixed hyperlipidemia: Secondary | ICD-10-CM | POA: Diagnosis not present

## 2020-02-24 DIAGNOSIS — E559 Vitamin D deficiency, unspecified: Secondary | ICD-10-CM | POA: Diagnosis not present

## 2020-02-24 DIAGNOSIS — N189 Chronic kidney disease, unspecified: Secondary | ICD-10-CM | POA: Diagnosis not present

## 2020-02-24 DIAGNOSIS — D518 Other vitamin B12 deficiency anemias: Secondary | ICD-10-CM | POA: Diagnosis not present

## 2020-02-24 DIAGNOSIS — E7800 Pure hypercholesterolemia, unspecified: Secondary | ICD-10-CM | POA: Diagnosis not present

## 2020-02-24 DIAGNOSIS — M6281 Muscle weakness (generalized): Secondary | ICD-10-CM | POA: Diagnosis not present

## 2020-02-24 DIAGNOSIS — E039 Hypothyroidism, unspecified: Secondary | ICD-10-CM | POA: Diagnosis not present

## 2020-02-24 DIAGNOSIS — D649 Anemia, unspecified: Secondary | ICD-10-CM | POA: Diagnosis not present

## 2020-02-25 DIAGNOSIS — F0391 Unspecified dementia with behavioral disturbance: Secondary | ICD-10-CM | POA: Diagnosis not present

## 2020-02-25 DIAGNOSIS — F0390 Unspecified dementia without behavioral disturbance: Secondary | ICD-10-CM | POA: Diagnosis not present

## 2020-02-25 DIAGNOSIS — F200 Paranoid schizophrenia: Secondary | ICD-10-CM | POA: Diagnosis not present

## 2020-02-25 DIAGNOSIS — F0281 Dementia in other diseases classified elsewhere with behavioral disturbance: Secondary | ICD-10-CM | POA: Diagnosis not present

## 2020-02-25 DIAGNOSIS — F064 Anxiety disorder due to known physiological condition: Secondary | ICD-10-CM | POA: Diagnosis not present

## 2020-02-25 DIAGNOSIS — F339 Major depressive disorder, recurrent, unspecified: Secondary | ICD-10-CM | POA: Diagnosis not present

## 2020-02-25 DIAGNOSIS — M6281 Muscle weakness (generalized): Secondary | ICD-10-CM | POA: Diagnosis not present

## 2020-02-25 DIAGNOSIS — R278 Other lack of coordination: Secondary | ICD-10-CM | POA: Diagnosis not present

## 2020-02-25 DIAGNOSIS — R41841 Cognitive communication deficit: Secondary | ICD-10-CM | POA: Diagnosis not present

## 2020-02-25 DIAGNOSIS — R410 Disorientation, unspecified: Secondary | ICD-10-CM | POA: Diagnosis not present

## 2020-02-26 DIAGNOSIS — R41841 Cognitive communication deficit: Secondary | ICD-10-CM | POA: Diagnosis not present

## 2020-02-26 DIAGNOSIS — R278 Other lack of coordination: Secondary | ICD-10-CM | POA: Diagnosis not present

## 2020-02-27 DIAGNOSIS — R41841 Cognitive communication deficit: Secondary | ICD-10-CM | POA: Diagnosis not present

## 2020-02-27 DIAGNOSIS — R278 Other lack of coordination: Secondary | ICD-10-CM | POA: Diagnosis not present

## 2020-02-28 DIAGNOSIS — R41841 Cognitive communication deficit: Secondary | ICD-10-CM | POA: Diagnosis not present

## 2020-02-28 DIAGNOSIS — R278 Other lack of coordination: Secondary | ICD-10-CM | POA: Diagnosis not present

## 2020-03-01 DIAGNOSIS — R41841 Cognitive communication deficit: Secondary | ICD-10-CM | POA: Diagnosis not present

## 2020-03-01 DIAGNOSIS — R278 Other lack of coordination: Secondary | ICD-10-CM | POA: Diagnosis not present

## 2020-03-02 DIAGNOSIS — R41841 Cognitive communication deficit: Secondary | ICD-10-CM | POA: Diagnosis not present

## 2020-03-02 DIAGNOSIS — R278 Other lack of coordination: Secondary | ICD-10-CM | POA: Diagnosis not present

## 2020-03-03 DIAGNOSIS — R41841 Cognitive communication deficit: Secondary | ICD-10-CM | POA: Diagnosis not present

## 2020-03-03 DIAGNOSIS — R278 Other lack of coordination: Secondary | ICD-10-CM | POA: Diagnosis not present

## 2020-03-04 DIAGNOSIS — Z23 Encounter for immunization: Secondary | ICD-10-CM | POA: Diagnosis not present

## 2020-03-04 DIAGNOSIS — R278 Other lack of coordination: Secondary | ICD-10-CM | POA: Diagnosis not present

## 2020-03-04 DIAGNOSIS — R41841 Cognitive communication deficit: Secondary | ICD-10-CM | POA: Diagnosis not present

## 2020-03-05 DIAGNOSIS — R278 Other lack of coordination: Secondary | ICD-10-CM | POA: Diagnosis not present

## 2020-03-05 DIAGNOSIS — R41841 Cognitive communication deficit: Secondary | ICD-10-CM | POA: Diagnosis not present

## 2020-03-08 DIAGNOSIS — I10 Essential (primary) hypertension: Secondary | ICD-10-CM | POA: Diagnosis not present

## 2020-03-08 DIAGNOSIS — G629 Polyneuropathy, unspecified: Secondary | ICD-10-CM | POA: Diagnosis not present

## 2020-03-08 DIAGNOSIS — E1140 Type 2 diabetes mellitus with diabetic neuropathy, unspecified: Secondary | ICD-10-CM | POA: Diagnosis not present

## 2020-03-08 DIAGNOSIS — R278 Other lack of coordination: Secondary | ICD-10-CM | POA: Diagnosis not present

## 2020-03-08 DIAGNOSIS — R41841 Cognitive communication deficit: Secondary | ICD-10-CM | POA: Diagnosis not present

## 2020-03-08 DIAGNOSIS — E785 Hyperlipidemia, unspecified: Secondary | ICD-10-CM | POA: Diagnosis not present

## 2020-03-09 DIAGNOSIS — R278 Other lack of coordination: Secondary | ICD-10-CM | POA: Diagnosis not present

## 2020-03-09 DIAGNOSIS — R41841 Cognitive communication deficit: Secondary | ICD-10-CM | POA: Diagnosis not present

## 2020-03-10 DIAGNOSIS — R278 Other lack of coordination: Secondary | ICD-10-CM | POA: Diagnosis not present

## 2020-03-10 DIAGNOSIS — R41841 Cognitive communication deficit: Secondary | ICD-10-CM | POA: Diagnosis not present

## 2020-03-11 DIAGNOSIS — R278 Other lack of coordination: Secondary | ICD-10-CM | POA: Diagnosis not present

## 2020-03-11 DIAGNOSIS — R41841 Cognitive communication deficit: Secondary | ICD-10-CM | POA: Diagnosis not present

## 2020-03-12 DIAGNOSIS — R278 Other lack of coordination: Secondary | ICD-10-CM | POA: Diagnosis not present

## 2020-03-12 DIAGNOSIS — R41841 Cognitive communication deficit: Secondary | ICD-10-CM | POA: Diagnosis not present

## 2020-03-13 DIAGNOSIS — R41841 Cognitive communication deficit: Secondary | ICD-10-CM | POA: Diagnosis not present

## 2020-03-13 DIAGNOSIS — R278 Other lack of coordination: Secondary | ICD-10-CM | POA: Diagnosis not present

## 2020-03-14 DIAGNOSIS — R278 Other lack of coordination: Secondary | ICD-10-CM | POA: Diagnosis not present

## 2020-03-14 DIAGNOSIS — R41841 Cognitive communication deficit: Secondary | ICD-10-CM | POA: Diagnosis not present

## 2020-03-15 DIAGNOSIS — R41841 Cognitive communication deficit: Secondary | ICD-10-CM | POA: Diagnosis not present

## 2020-03-15 DIAGNOSIS — R278 Other lack of coordination: Secondary | ICD-10-CM | POA: Diagnosis not present

## 2020-03-16 DIAGNOSIS — R278 Other lack of coordination: Secondary | ICD-10-CM | POA: Diagnosis not present

## 2020-03-16 DIAGNOSIS — R41841 Cognitive communication deficit: Secondary | ICD-10-CM | POA: Diagnosis not present

## 2020-03-17 DIAGNOSIS — E119 Type 2 diabetes mellitus without complications: Secondary | ICD-10-CM | POA: Diagnosis not present

## 2020-03-17 DIAGNOSIS — R41841 Cognitive communication deficit: Secondary | ICD-10-CM | POA: Diagnosis not present

## 2020-03-17 DIAGNOSIS — G629 Polyneuropathy, unspecified: Secondary | ICD-10-CM | POA: Diagnosis not present

## 2020-03-17 DIAGNOSIS — F0390 Unspecified dementia without behavioral disturbance: Secondary | ICD-10-CM | POA: Diagnosis not present

## 2020-03-17 DIAGNOSIS — R278 Other lack of coordination: Secondary | ICD-10-CM | POA: Diagnosis not present

## 2020-03-17 DIAGNOSIS — E785 Hyperlipidemia, unspecified: Secondary | ICD-10-CM | POA: Diagnosis not present

## 2020-03-17 DIAGNOSIS — I10 Essential (primary) hypertension: Secondary | ICD-10-CM | POA: Diagnosis not present

## 2020-03-18 DIAGNOSIS — R278 Other lack of coordination: Secondary | ICD-10-CM | POA: Diagnosis not present

## 2020-03-18 DIAGNOSIS — R41841 Cognitive communication deficit: Secondary | ICD-10-CM | POA: Diagnosis not present

## 2020-03-19 DIAGNOSIS — R41841 Cognitive communication deficit: Secondary | ICD-10-CM | POA: Diagnosis not present

## 2020-03-19 DIAGNOSIS — R278 Other lack of coordination: Secondary | ICD-10-CM | POA: Diagnosis not present

## 2020-03-20 DIAGNOSIS — R278 Other lack of coordination: Secondary | ICD-10-CM | POA: Diagnosis not present

## 2020-03-20 DIAGNOSIS — R41841 Cognitive communication deficit: Secondary | ICD-10-CM | POA: Diagnosis not present

## 2020-03-22 DIAGNOSIS — R278 Other lack of coordination: Secondary | ICD-10-CM | POA: Diagnosis not present

## 2020-03-22 DIAGNOSIS — R41841 Cognitive communication deficit: Secondary | ICD-10-CM | POA: Diagnosis not present

## 2020-03-23 DIAGNOSIS — R41841 Cognitive communication deficit: Secondary | ICD-10-CM | POA: Diagnosis not present

## 2020-03-23 DIAGNOSIS — R278 Other lack of coordination: Secondary | ICD-10-CM | POA: Diagnosis not present

## 2020-03-24 DIAGNOSIS — F064 Anxiety disorder due to known physiological condition: Secondary | ICD-10-CM | POA: Diagnosis not present

## 2020-03-24 DIAGNOSIS — F339 Major depressive disorder, recurrent, unspecified: Secondary | ICD-10-CM | POA: Diagnosis not present

## 2020-03-24 DIAGNOSIS — R41841 Cognitive communication deficit: Secondary | ICD-10-CM | POA: Diagnosis not present

## 2020-03-24 DIAGNOSIS — R278 Other lack of coordination: Secondary | ICD-10-CM | POA: Diagnosis not present

## 2020-03-24 DIAGNOSIS — F0391 Unspecified dementia with behavioral disturbance: Secondary | ICD-10-CM | POA: Diagnosis not present

## 2020-03-24 DIAGNOSIS — F200 Paranoid schizophrenia: Secondary | ICD-10-CM | POA: Diagnosis not present

## 2020-03-25 DIAGNOSIS — R41841 Cognitive communication deficit: Secondary | ICD-10-CM | POA: Diagnosis not present

## 2020-03-25 DIAGNOSIS — R278 Other lack of coordination: Secondary | ICD-10-CM | POA: Diagnosis not present

## 2020-03-26 DIAGNOSIS — R278 Other lack of coordination: Secondary | ICD-10-CM | POA: Diagnosis not present

## 2020-03-26 DIAGNOSIS — R41841 Cognitive communication deficit: Secondary | ICD-10-CM | POA: Diagnosis not present

## 2020-03-27 DIAGNOSIS — R278 Other lack of coordination: Secondary | ICD-10-CM | POA: Diagnosis not present

## 2020-03-27 DIAGNOSIS — R41841 Cognitive communication deficit: Secondary | ICD-10-CM | POA: Diagnosis not present

## 2020-03-28 DIAGNOSIS — R41841 Cognitive communication deficit: Secondary | ICD-10-CM | POA: Diagnosis not present

## 2020-03-28 DIAGNOSIS — R278 Other lack of coordination: Secondary | ICD-10-CM | POA: Diagnosis not present

## 2020-03-29 DIAGNOSIS — R278 Other lack of coordination: Secondary | ICD-10-CM | POA: Diagnosis not present

## 2020-03-29 DIAGNOSIS — R41841 Cognitive communication deficit: Secondary | ICD-10-CM | POA: Diagnosis not present

## 2020-03-30 DIAGNOSIS — R41841 Cognitive communication deficit: Secondary | ICD-10-CM | POA: Diagnosis not present

## 2020-03-30 DIAGNOSIS — R278 Other lack of coordination: Secondary | ICD-10-CM | POA: Diagnosis not present

## 2020-03-31 DIAGNOSIS — R278 Other lack of coordination: Secondary | ICD-10-CM | POA: Diagnosis not present

## 2020-03-31 DIAGNOSIS — R41841 Cognitive communication deficit: Secondary | ICD-10-CM | POA: Diagnosis not present

## 2020-04-01 DIAGNOSIS — R41841 Cognitive communication deficit: Secondary | ICD-10-CM | POA: Diagnosis not present

## 2020-04-01 DIAGNOSIS — Z23 Encounter for immunization: Secondary | ICD-10-CM | POA: Diagnosis not present

## 2020-04-01 DIAGNOSIS — R278 Other lack of coordination: Secondary | ICD-10-CM | POA: Diagnosis not present

## 2020-04-02 DIAGNOSIS — R41841 Cognitive communication deficit: Secondary | ICD-10-CM | POA: Diagnosis not present

## 2020-04-02 DIAGNOSIS — R278 Other lack of coordination: Secondary | ICD-10-CM | POA: Diagnosis not present

## 2020-04-05 DIAGNOSIS — R41841 Cognitive communication deficit: Secondary | ICD-10-CM | POA: Diagnosis not present

## 2020-04-05 DIAGNOSIS — R278 Other lack of coordination: Secondary | ICD-10-CM | POA: Diagnosis not present

## 2020-04-06 DIAGNOSIS — R41841 Cognitive communication deficit: Secondary | ICD-10-CM | POA: Diagnosis not present

## 2020-04-06 DIAGNOSIS — R278 Other lack of coordination: Secondary | ICD-10-CM | POA: Diagnosis not present

## 2020-04-07 DIAGNOSIS — F200 Paranoid schizophrenia: Secondary | ICD-10-CM | POA: Diagnosis not present

## 2020-04-07 DIAGNOSIS — F339 Major depressive disorder, recurrent, unspecified: Secondary | ICD-10-CM | POA: Diagnosis not present

## 2020-04-07 DIAGNOSIS — R41841 Cognitive communication deficit: Secondary | ICD-10-CM | POA: Diagnosis not present

## 2020-04-07 DIAGNOSIS — R278 Other lack of coordination: Secondary | ICD-10-CM | POA: Diagnosis not present

## 2020-04-07 DIAGNOSIS — F0391 Unspecified dementia with behavioral disturbance: Secondary | ICD-10-CM | POA: Diagnosis not present

## 2020-04-07 DIAGNOSIS — F064 Anxiety disorder due to known physiological condition: Secondary | ICD-10-CM | POA: Diagnosis not present

## 2020-04-08 DIAGNOSIS — R41841 Cognitive communication deficit: Secondary | ICD-10-CM | POA: Diagnosis not present

## 2020-04-08 DIAGNOSIS — R278 Other lack of coordination: Secondary | ICD-10-CM | POA: Diagnosis not present

## 2020-04-13 DIAGNOSIS — E1165 Type 2 diabetes mellitus with hyperglycemia: Secondary | ICD-10-CM | POA: Diagnosis not present

## 2020-04-13 DIAGNOSIS — E559 Vitamin D deficiency, unspecified: Secondary | ICD-10-CM | POA: Diagnosis not present

## 2020-04-13 DIAGNOSIS — I10 Essential (primary) hypertension: Secondary | ICD-10-CM | POA: Diagnosis not present

## 2020-04-13 DIAGNOSIS — F0281 Dementia in other diseases classified elsewhere with behavioral disturbance: Secondary | ICD-10-CM | POA: Diagnosis not present

## 2020-04-13 DIAGNOSIS — F209 Schizophrenia, unspecified: Secondary | ICD-10-CM | POA: Diagnosis not present

## 2020-04-22 DIAGNOSIS — F0391 Unspecified dementia with behavioral disturbance: Secondary | ICD-10-CM | POA: Diagnosis not present

## 2020-04-22 DIAGNOSIS — F200 Paranoid schizophrenia: Secondary | ICD-10-CM | POA: Diagnosis not present

## 2020-04-22 DIAGNOSIS — F064 Anxiety disorder due to known physiological condition: Secondary | ICD-10-CM | POA: Diagnosis not present

## 2020-04-22 DIAGNOSIS — F339 Major depressive disorder, recurrent, unspecified: Secondary | ICD-10-CM | POA: Diagnosis not present

## 2020-04-22 DIAGNOSIS — F5109 Other insomnia not due to a substance or known physiological condition: Secondary | ICD-10-CM | POA: Diagnosis not present

## 2020-04-30 DIAGNOSIS — E119 Type 2 diabetes mellitus without complications: Secondary | ICD-10-CM | POA: Diagnosis not present

## 2020-04-30 DIAGNOSIS — F0390 Unspecified dementia without behavioral disturbance: Secondary | ICD-10-CM | POA: Diagnosis not present

## 2020-04-30 DIAGNOSIS — F209 Schizophrenia, unspecified: Secondary | ICD-10-CM | POA: Diagnosis not present

## 2020-04-30 DIAGNOSIS — I10 Essential (primary) hypertension: Secondary | ICD-10-CM | POA: Diagnosis not present

## 2020-06-03 DIAGNOSIS — R41841 Cognitive communication deficit: Secondary | ICD-10-CM | POA: Diagnosis not present

## 2020-06-07 DIAGNOSIS — F0390 Unspecified dementia without behavioral disturbance: Secondary | ICD-10-CM | POA: Diagnosis not present

## 2020-06-07 DIAGNOSIS — I10 Essential (primary) hypertension: Secondary | ICD-10-CM | POA: Diagnosis not present

## 2020-06-07 DIAGNOSIS — E119 Type 2 diabetes mellitus without complications: Secondary | ICD-10-CM | POA: Diagnosis not present

## 2020-06-07 DIAGNOSIS — E785 Hyperlipidemia, unspecified: Secondary | ICD-10-CM | POA: Diagnosis not present

## 2020-06-23 DIAGNOSIS — F200 Paranoid schizophrenia: Secondary | ICD-10-CM | POA: Diagnosis not present

## 2020-06-23 DIAGNOSIS — F0391 Unspecified dementia with behavioral disturbance: Secondary | ICD-10-CM | POA: Diagnosis not present

## 2020-06-23 DIAGNOSIS — F339 Major depressive disorder, recurrent, unspecified: Secondary | ICD-10-CM | POA: Diagnosis not present

## 2020-06-23 DIAGNOSIS — F064 Anxiety disorder due to known physiological condition: Secondary | ICD-10-CM | POA: Diagnosis not present

## 2020-06-23 DIAGNOSIS — F5109 Other insomnia not due to a substance or known physiological condition: Secondary | ICD-10-CM | POA: Diagnosis not present

## 2020-06-25 DIAGNOSIS — F0281 Dementia in other diseases classified elsewhere with behavioral disturbance: Secondary | ICD-10-CM | POA: Diagnosis not present

## 2020-06-25 DIAGNOSIS — I10 Essential (primary) hypertension: Secondary | ICD-10-CM | POA: Diagnosis not present

## 2020-06-25 DIAGNOSIS — R278 Other lack of coordination: Secondary | ICD-10-CM | POA: Diagnosis not present

## 2020-06-25 DIAGNOSIS — E7849 Other hyperlipidemia: Secondary | ICD-10-CM | POA: Diagnosis not present

## 2020-06-25 DIAGNOSIS — E1165 Type 2 diabetes mellitus with hyperglycemia: Secondary | ICD-10-CM | POA: Diagnosis not present

## 2020-06-25 DIAGNOSIS — Z7984 Long term (current) use of oral hypoglycemic drugs: Secondary | ICD-10-CM | POA: Diagnosis not present

## 2020-06-27 DIAGNOSIS — M6281 Muscle weakness (generalized): Secondary | ICD-10-CM | POA: Diagnosis not present

## 2020-06-27 DIAGNOSIS — F0281 Dementia in other diseases classified elsewhere with behavioral disturbance: Secondary | ICD-10-CM | POA: Diagnosis not present

## 2020-06-27 DIAGNOSIS — R278 Other lack of coordination: Secondary | ICD-10-CM | POA: Diagnosis not present

## 2020-06-28 DIAGNOSIS — F0281 Dementia in other diseases classified elsewhere with behavioral disturbance: Secondary | ICD-10-CM | POA: Diagnosis not present

## 2020-06-28 DIAGNOSIS — M6281 Muscle weakness (generalized): Secondary | ICD-10-CM | POA: Diagnosis not present

## 2020-06-28 DIAGNOSIS — R278 Other lack of coordination: Secondary | ICD-10-CM | POA: Diagnosis not present

## 2020-06-30 DIAGNOSIS — F0281 Dementia in other diseases classified elsewhere with behavioral disturbance: Secondary | ICD-10-CM | POA: Diagnosis not present

## 2020-06-30 DIAGNOSIS — R278 Other lack of coordination: Secondary | ICD-10-CM | POA: Diagnosis not present

## 2020-06-30 DIAGNOSIS — M6281 Muscle weakness (generalized): Secondary | ICD-10-CM | POA: Diagnosis not present

## 2020-07-01 DIAGNOSIS — R278 Other lack of coordination: Secondary | ICD-10-CM | POA: Diagnosis not present

## 2020-07-01 DIAGNOSIS — M6281 Muscle weakness (generalized): Secondary | ICD-10-CM | POA: Diagnosis not present

## 2020-07-01 DIAGNOSIS — F0281 Dementia in other diseases classified elsewhere with behavioral disturbance: Secondary | ICD-10-CM | POA: Diagnosis not present

## 2020-07-02 DIAGNOSIS — M6281 Muscle weakness (generalized): Secondary | ICD-10-CM | POA: Diagnosis not present

## 2020-07-02 DIAGNOSIS — F0281 Dementia in other diseases classified elsewhere with behavioral disturbance: Secondary | ICD-10-CM | POA: Diagnosis not present

## 2020-07-02 DIAGNOSIS — R278 Other lack of coordination: Secondary | ICD-10-CM | POA: Diagnosis not present

## 2020-07-03 DIAGNOSIS — F0281 Dementia in other diseases classified elsewhere with behavioral disturbance: Secondary | ICD-10-CM | POA: Diagnosis not present

## 2020-07-03 DIAGNOSIS — M6281 Muscle weakness (generalized): Secondary | ICD-10-CM | POA: Diagnosis not present

## 2020-07-03 DIAGNOSIS — R278 Other lack of coordination: Secondary | ICD-10-CM | POA: Diagnosis not present

## 2020-07-04 DIAGNOSIS — F0281 Dementia in other diseases classified elsewhere with behavioral disturbance: Secondary | ICD-10-CM | POA: Diagnosis not present

## 2020-07-04 DIAGNOSIS — M6281 Muscle weakness (generalized): Secondary | ICD-10-CM | POA: Diagnosis not present

## 2020-07-04 DIAGNOSIS — R278 Other lack of coordination: Secondary | ICD-10-CM | POA: Diagnosis not present

## 2020-07-05 DIAGNOSIS — M6281 Muscle weakness (generalized): Secondary | ICD-10-CM | POA: Diagnosis not present

## 2020-07-05 DIAGNOSIS — F0281 Dementia in other diseases classified elsewhere with behavioral disturbance: Secondary | ICD-10-CM | POA: Diagnosis not present

## 2020-07-05 DIAGNOSIS — R278 Other lack of coordination: Secondary | ICD-10-CM | POA: Diagnosis not present

## 2020-07-06 DIAGNOSIS — M6281 Muscle weakness (generalized): Secondary | ICD-10-CM | POA: Diagnosis not present

## 2020-07-06 DIAGNOSIS — R278 Other lack of coordination: Secondary | ICD-10-CM | POA: Diagnosis not present

## 2020-07-06 DIAGNOSIS — F0281 Dementia in other diseases classified elsewhere with behavioral disturbance: Secondary | ICD-10-CM | POA: Diagnosis not present

## 2020-07-07 DIAGNOSIS — R278 Other lack of coordination: Secondary | ICD-10-CM | POA: Diagnosis not present

## 2020-07-07 DIAGNOSIS — M6281 Muscle weakness (generalized): Secondary | ICD-10-CM | POA: Diagnosis not present

## 2020-07-07 DIAGNOSIS — F0281 Dementia in other diseases classified elsewhere with behavioral disturbance: Secondary | ICD-10-CM | POA: Diagnosis not present

## 2020-07-08 DIAGNOSIS — R278 Other lack of coordination: Secondary | ICD-10-CM | POA: Diagnosis not present

## 2020-07-08 DIAGNOSIS — M6281 Muscle weakness (generalized): Secondary | ICD-10-CM | POA: Diagnosis not present

## 2020-07-08 DIAGNOSIS — F0281 Dementia in other diseases classified elsewhere with behavioral disturbance: Secondary | ICD-10-CM | POA: Diagnosis not present

## 2020-07-09 DIAGNOSIS — F0281 Dementia in other diseases classified elsewhere with behavioral disturbance: Secondary | ICD-10-CM | POA: Diagnosis not present

## 2020-07-09 DIAGNOSIS — R278 Other lack of coordination: Secondary | ICD-10-CM | POA: Diagnosis not present

## 2020-07-09 DIAGNOSIS — G300 Alzheimer's disease with early onset: Secondary | ICD-10-CM | POA: Diagnosis not present

## 2020-07-09 DIAGNOSIS — E119 Type 2 diabetes mellitus without complications: Secondary | ICD-10-CM | POA: Diagnosis not present

## 2020-07-09 DIAGNOSIS — G629 Polyneuropathy, unspecified: Secondary | ICD-10-CM | POA: Diagnosis not present

## 2020-07-09 DIAGNOSIS — M6281 Muscle weakness (generalized): Secondary | ICD-10-CM | POA: Diagnosis not present

## 2020-07-10 DIAGNOSIS — F0281 Dementia in other diseases classified elsewhere with behavioral disturbance: Secondary | ICD-10-CM | POA: Diagnosis not present

## 2020-07-10 DIAGNOSIS — R278 Other lack of coordination: Secondary | ICD-10-CM | POA: Diagnosis not present

## 2020-07-10 DIAGNOSIS — M6281 Muscle weakness (generalized): Secondary | ICD-10-CM | POA: Diagnosis not present

## 2020-07-11 DIAGNOSIS — M6281 Muscle weakness (generalized): Secondary | ICD-10-CM | POA: Diagnosis not present

## 2020-07-11 DIAGNOSIS — F0281 Dementia in other diseases classified elsewhere with behavioral disturbance: Secondary | ICD-10-CM | POA: Diagnosis not present

## 2020-07-11 DIAGNOSIS — R278 Other lack of coordination: Secondary | ICD-10-CM | POA: Diagnosis not present

## 2020-07-12 DIAGNOSIS — F0281 Dementia in other diseases classified elsewhere with behavioral disturbance: Secondary | ICD-10-CM | POA: Diagnosis not present

## 2020-07-12 DIAGNOSIS — M6281 Muscle weakness (generalized): Secondary | ICD-10-CM | POA: Diagnosis not present

## 2020-07-12 DIAGNOSIS — R278 Other lack of coordination: Secondary | ICD-10-CM | POA: Diagnosis not present

## 2020-07-13 DIAGNOSIS — R278 Other lack of coordination: Secondary | ICD-10-CM | POA: Diagnosis not present

## 2020-07-13 DIAGNOSIS — M6281 Muscle weakness (generalized): Secondary | ICD-10-CM | POA: Diagnosis not present

## 2020-07-13 DIAGNOSIS — F0281 Dementia in other diseases classified elsewhere with behavioral disturbance: Secondary | ICD-10-CM | POA: Diagnosis not present

## 2020-07-14 DIAGNOSIS — F331 Major depressive disorder, recurrent, moderate: Secondary | ICD-10-CM | POA: Diagnosis not present

## 2020-07-14 DIAGNOSIS — L814 Other melanin hyperpigmentation: Secondary | ICD-10-CM | POA: Diagnosis not present

## 2020-07-14 DIAGNOSIS — F064 Anxiety disorder due to known physiological condition: Secondary | ICD-10-CM | POA: Diagnosis not present

## 2020-07-14 DIAGNOSIS — M6281 Muscle weakness (generalized): Secondary | ICD-10-CM | POA: Diagnosis not present

## 2020-07-14 DIAGNOSIS — F3341 Major depressive disorder, recurrent, in partial remission: Secondary | ICD-10-CM | POA: Diagnosis not present

## 2020-07-14 DIAGNOSIS — F200 Paranoid schizophrenia: Secondary | ICD-10-CM | POA: Diagnosis not present

## 2020-07-14 DIAGNOSIS — F0281 Dementia in other diseases classified elsewhere with behavioral disturbance: Secondary | ICD-10-CM | POA: Diagnosis not present

## 2020-07-14 DIAGNOSIS — R278 Other lack of coordination: Secondary | ICD-10-CM | POA: Diagnosis not present

## 2020-07-14 DIAGNOSIS — L821 Other seborrheic keratosis: Secondary | ICD-10-CM | POA: Diagnosis not present

## 2020-07-15 DIAGNOSIS — R278 Other lack of coordination: Secondary | ICD-10-CM | POA: Diagnosis not present

## 2020-07-15 DIAGNOSIS — F0281 Dementia in other diseases classified elsewhere with behavioral disturbance: Secondary | ICD-10-CM | POA: Diagnosis not present

## 2020-07-15 DIAGNOSIS — M6281 Muscle weakness (generalized): Secondary | ICD-10-CM | POA: Diagnosis not present

## 2020-07-16 DIAGNOSIS — F0281 Dementia in other diseases classified elsewhere with behavioral disturbance: Secondary | ICD-10-CM | POA: Diagnosis not present

## 2020-07-16 DIAGNOSIS — R278 Other lack of coordination: Secondary | ICD-10-CM | POA: Diagnosis not present

## 2020-07-16 DIAGNOSIS — M6281 Muscle weakness (generalized): Secondary | ICD-10-CM | POA: Diagnosis not present

## 2020-07-17 DIAGNOSIS — R278 Other lack of coordination: Secondary | ICD-10-CM | POA: Diagnosis not present

## 2020-07-17 DIAGNOSIS — F0281 Dementia in other diseases classified elsewhere with behavioral disturbance: Secondary | ICD-10-CM | POA: Diagnosis not present

## 2020-07-17 DIAGNOSIS — M6281 Muscle weakness (generalized): Secondary | ICD-10-CM | POA: Diagnosis not present

## 2020-07-19 DIAGNOSIS — M6281 Muscle weakness (generalized): Secondary | ICD-10-CM | POA: Diagnosis not present

## 2020-07-19 DIAGNOSIS — F0281 Dementia in other diseases classified elsewhere with behavioral disturbance: Secondary | ICD-10-CM | POA: Diagnosis not present

## 2020-07-19 DIAGNOSIS — R278 Other lack of coordination: Secondary | ICD-10-CM | POA: Diagnosis not present

## 2020-07-20 DIAGNOSIS — F0281 Dementia in other diseases classified elsewhere with behavioral disturbance: Secondary | ICD-10-CM | POA: Diagnosis not present

## 2020-07-20 DIAGNOSIS — M6281 Muscle weakness (generalized): Secondary | ICD-10-CM | POA: Diagnosis not present

## 2020-07-20 DIAGNOSIS — R278 Other lack of coordination: Secondary | ICD-10-CM | POA: Diagnosis not present

## 2020-07-21 DIAGNOSIS — R278 Other lack of coordination: Secondary | ICD-10-CM | POA: Diagnosis not present

## 2020-07-21 DIAGNOSIS — M6281 Muscle weakness (generalized): Secondary | ICD-10-CM | POA: Diagnosis not present

## 2020-07-21 DIAGNOSIS — F0281 Dementia in other diseases classified elsewhere with behavioral disturbance: Secondary | ICD-10-CM | POA: Diagnosis not present

## 2020-07-22 DIAGNOSIS — M6281 Muscle weakness (generalized): Secondary | ICD-10-CM | POA: Diagnosis not present

## 2020-07-22 DIAGNOSIS — F064 Anxiety disorder due to known physiological condition: Secondary | ICD-10-CM | POA: Diagnosis not present

## 2020-07-22 DIAGNOSIS — F200 Paranoid schizophrenia: Secondary | ICD-10-CM | POA: Diagnosis not present

## 2020-07-22 DIAGNOSIS — F0281 Dementia in other diseases classified elsewhere with behavioral disturbance: Secondary | ICD-10-CM | POA: Diagnosis not present

## 2020-07-22 DIAGNOSIS — R278 Other lack of coordination: Secondary | ICD-10-CM | POA: Diagnosis not present

## 2020-07-22 DIAGNOSIS — F5109 Other insomnia not due to a substance or known physiological condition: Secondary | ICD-10-CM | POA: Diagnosis not present

## 2020-07-22 DIAGNOSIS — F0391 Unspecified dementia with behavioral disturbance: Secondary | ICD-10-CM | POA: Diagnosis not present

## 2020-07-22 DIAGNOSIS — F331 Major depressive disorder, recurrent, moderate: Secondary | ICD-10-CM | POA: Diagnosis not present

## 2020-07-23 DIAGNOSIS — F0281 Dementia in other diseases classified elsewhere with behavioral disturbance: Secondary | ICD-10-CM | POA: Diagnosis not present

## 2020-07-23 DIAGNOSIS — R278 Other lack of coordination: Secondary | ICD-10-CM | POA: Diagnosis not present

## 2020-07-23 DIAGNOSIS — M6281 Muscle weakness (generalized): Secondary | ICD-10-CM | POA: Diagnosis not present

## 2020-07-26 DIAGNOSIS — R278 Other lack of coordination: Secondary | ICD-10-CM | POA: Diagnosis not present

## 2020-07-26 DIAGNOSIS — M6281 Muscle weakness (generalized): Secondary | ICD-10-CM | POA: Diagnosis not present

## 2020-07-26 DIAGNOSIS — F0281 Dementia in other diseases classified elsewhere with behavioral disturbance: Secondary | ICD-10-CM | POA: Diagnosis not present

## 2020-07-27 DIAGNOSIS — F0281 Dementia in other diseases classified elsewhere with behavioral disturbance: Secondary | ICD-10-CM | POA: Diagnosis not present

## 2020-07-27 DIAGNOSIS — M6281 Muscle weakness (generalized): Secondary | ICD-10-CM | POA: Diagnosis not present

## 2020-07-27 DIAGNOSIS — R278 Other lack of coordination: Secondary | ICD-10-CM | POA: Diagnosis not present

## 2020-07-28 DIAGNOSIS — M6281 Muscle weakness (generalized): Secondary | ICD-10-CM | POA: Diagnosis not present

## 2020-07-28 DIAGNOSIS — R296 Repeated falls: Secondary | ICD-10-CM | POA: Diagnosis not present

## 2020-07-28 DIAGNOSIS — I10 Essential (primary) hypertension: Secondary | ICD-10-CM | POA: Diagnosis not present

## 2020-07-28 DIAGNOSIS — F209 Schizophrenia, unspecified: Secondary | ICD-10-CM | POA: Diagnosis not present

## 2020-07-28 DIAGNOSIS — R4182 Altered mental status, unspecified: Secondary | ICD-10-CM | POA: Diagnosis not present

## 2020-07-28 DIAGNOSIS — F0281 Dementia in other diseases classified elsewhere with behavioral disturbance: Secondary | ICD-10-CM | POA: Diagnosis not present

## 2020-07-28 DIAGNOSIS — E1140 Type 2 diabetes mellitus with diabetic neuropathy, unspecified: Secondary | ICD-10-CM | POA: Diagnosis not present

## 2020-07-28 DIAGNOSIS — R278 Other lack of coordination: Secondary | ICD-10-CM | POA: Diagnosis not present

## 2020-07-28 DIAGNOSIS — G629 Polyneuropathy, unspecified: Secondary | ICD-10-CM | POA: Diagnosis not present

## 2020-07-29 DIAGNOSIS — M6281 Muscle weakness (generalized): Secondary | ICD-10-CM | POA: Diagnosis not present

## 2020-07-29 DIAGNOSIS — F0280 Dementia in other diseases classified elsewhere without behavioral disturbance: Secondary | ICD-10-CM | POA: Diagnosis not present

## 2020-07-29 DIAGNOSIS — R278 Other lack of coordination: Secondary | ICD-10-CM | POA: Diagnosis not present

## 2020-07-29 DIAGNOSIS — I10 Essential (primary) hypertension: Secondary | ICD-10-CM | POA: Diagnosis not present

## 2020-07-29 DIAGNOSIS — E119 Type 2 diabetes mellitus without complications: Secondary | ICD-10-CM | POA: Diagnosis not present

## 2020-07-29 DIAGNOSIS — F0281 Dementia in other diseases classified elsewhere with behavioral disturbance: Secondary | ICD-10-CM | POA: Diagnosis not present

## 2020-07-29 DIAGNOSIS — G309 Alzheimer's disease, unspecified: Secondary | ICD-10-CM | POA: Diagnosis not present

## 2020-07-30 DIAGNOSIS — R278 Other lack of coordination: Secondary | ICD-10-CM | POA: Diagnosis not present

## 2020-07-30 DIAGNOSIS — F0281 Dementia in other diseases classified elsewhere with behavioral disturbance: Secondary | ICD-10-CM | POA: Diagnosis not present

## 2020-07-30 DIAGNOSIS — M6281 Muscle weakness (generalized): Secondary | ICD-10-CM | POA: Diagnosis not present

## 2020-07-31 DIAGNOSIS — F0281 Dementia in other diseases classified elsewhere with behavioral disturbance: Secondary | ICD-10-CM | POA: Diagnosis not present

## 2020-07-31 DIAGNOSIS — M6281 Muscle weakness (generalized): Secondary | ICD-10-CM | POA: Diagnosis not present

## 2020-07-31 DIAGNOSIS — R278 Other lack of coordination: Secondary | ICD-10-CM | POA: Diagnosis not present

## 2020-08-02 DIAGNOSIS — R278 Other lack of coordination: Secondary | ICD-10-CM | POA: Diagnosis not present

## 2020-08-02 DIAGNOSIS — F0281 Dementia in other diseases classified elsewhere with behavioral disturbance: Secondary | ICD-10-CM | POA: Diagnosis not present

## 2020-08-02 DIAGNOSIS — M6281 Muscle weakness (generalized): Secondary | ICD-10-CM | POA: Diagnosis not present

## 2020-08-03 DIAGNOSIS — M6281 Muscle weakness (generalized): Secondary | ICD-10-CM | POA: Diagnosis not present

## 2020-08-03 DIAGNOSIS — Z794 Long term (current) use of insulin: Secondary | ICD-10-CM | POA: Diagnosis not present

## 2020-08-03 DIAGNOSIS — R278 Other lack of coordination: Secondary | ICD-10-CM | POA: Diagnosis not present

## 2020-08-03 DIAGNOSIS — G309 Alzheimer's disease, unspecified: Secondary | ICD-10-CM | POA: Diagnosis not present

## 2020-08-03 DIAGNOSIS — E1165 Type 2 diabetes mellitus with hyperglycemia: Secondary | ICD-10-CM | POA: Diagnosis not present

## 2020-08-03 DIAGNOSIS — F0281 Dementia in other diseases classified elsewhere with behavioral disturbance: Secondary | ICD-10-CM | POA: Diagnosis not present

## 2020-08-04 DIAGNOSIS — R531 Weakness: Secondary | ICD-10-CM | POA: Diagnosis not present

## 2020-08-04 DIAGNOSIS — M6281 Muscle weakness (generalized): Secondary | ICD-10-CM | POA: Diagnosis not present

## 2020-08-04 DIAGNOSIS — G309 Alzheimer's disease, unspecified: Secondary | ICD-10-CM | POA: Diagnosis not present

## 2020-08-04 DIAGNOSIS — R278 Other lack of coordination: Secondary | ICD-10-CM | POA: Diagnosis not present

## 2020-08-04 DIAGNOSIS — F0281 Dementia in other diseases classified elsewhere with behavioral disturbance: Secondary | ICD-10-CM | POA: Diagnosis not present

## 2020-08-05 ENCOUNTER — Emergency Department (HOSPITAL_COMMUNITY): Payer: Medicare Other

## 2020-08-05 ENCOUNTER — Encounter (HOSPITAL_COMMUNITY): Payer: Self-pay | Admitting: *Deleted

## 2020-08-05 ENCOUNTER — Emergency Department (HOSPITAL_COMMUNITY)
Admission: EM | Admit: 2020-08-05 | Discharge: 2020-08-05 | Disposition: A | Discharge status: Home / Self Care | Payer: Medicare Other | Attending: Emergency Medicine | Admitting: Emergency Medicine

## 2020-08-05 ENCOUNTER — Other Ambulatory Visit: Payer: Self-pay

## 2020-08-05 DIAGNOSIS — I10 Essential (primary) hypertension: Secondary | ICD-10-CM | POA: Diagnosis not present

## 2020-08-05 DIAGNOSIS — R410 Disorientation, unspecified: Secondary | ICD-10-CM | POA: Diagnosis not present

## 2020-08-05 DIAGNOSIS — Z7984 Long term (current) use of oral hypoglycemic drugs: Secondary | ICD-10-CM | POA: Insufficient documentation

## 2020-08-05 DIAGNOSIS — E1165 Type 2 diabetes mellitus with hyperglycemia: Secondary | ICD-10-CM | POA: Diagnosis not present

## 2020-08-05 DIAGNOSIS — R4182 Altered mental status, unspecified: Secondary | ICD-10-CM | POA: Diagnosis not present

## 2020-08-05 DIAGNOSIS — R269 Unspecified abnormalities of gait and mobility: Secondary | ICD-10-CM | POA: Diagnosis not present

## 2020-08-05 DIAGNOSIS — Z87891 Personal history of nicotine dependence: Secondary | ICD-10-CM | POA: Diagnosis not present

## 2020-08-05 DIAGNOSIS — G319 Degenerative disease of nervous system, unspecified: Secondary | ICD-10-CM | POA: Diagnosis not present

## 2020-08-05 DIAGNOSIS — W19XXXA Unspecified fall, initial encounter: Secondary | ICD-10-CM | POA: Diagnosis not present

## 2020-08-05 DIAGNOSIS — Z79899 Other long term (current) drug therapy: Secondary | ICD-10-CM | POA: Diagnosis not present

## 2020-08-05 DIAGNOSIS — E119 Type 2 diabetes mellitus without complications: Secondary | ICD-10-CM | POA: Insufficient documentation

## 2020-08-05 DIAGNOSIS — Z981 Arthrodesis status: Secondary | ICD-10-CM | POA: Diagnosis not present

## 2020-08-05 DIAGNOSIS — J3489 Other specified disorders of nose and nasal sinuses: Secondary | ICD-10-CM | POA: Diagnosis not present

## 2020-08-05 LAB — COMPREHENSIVE METABOLIC PANEL
ALT: 18 U/L (ref 0–44)
AST: 23 U/L (ref 15–41)
Albumin: 4.4 g/dL (ref 3.5–5.0)
Alkaline Phosphatase: 47 U/L (ref 38–126)
Anion gap: 13 (ref 5–15)
BUN: 33 mg/dL — ABNORMAL HIGH (ref 8–23)
CO2: 25 mmol/L (ref 22–32)
Calcium: 9.7 mg/dL (ref 8.9–10.3)
Chloride: 102 mmol/L (ref 98–111)
Creatinine, Ser: 1.21 mg/dL (ref 0.61–1.24)
GFR calc Af Amer: >60 mL/min (ref >60)
GFR calc non Af Amer: >60 mL/min (ref >60)
Glucose, Bld: 254 mg/dL — ABNORMAL HIGH (ref 70–99)
Potassium: 4.1 mmol/L (ref 3.5–5.1)
Sodium: 140 mmol/L (ref 135–145)
Total Bilirubin: 0.6 mg/dL (ref 0.3–1.2)
Total Protein: 7.8 g/dL (ref 6.5–8.1)

## 2020-08-05 LAB — CBC WITH DIFFERENTIAL/PLATELET
Abs Immature Granulocytes: 0.02 10*3/uL (ref 0.00–0.07)
Basophils Absolute: 0.1 10*3/uL (ref 0.0–0.1)
Basophils Relative: 1 %
Eosinophils Absolute: 0.2 10*3/uL (ref 0.0–0.5)
Eosinophils Relative: 2 %
HCT: 38.9 % — ABNORMAL LOW (ref 39.0–52.0)
Hemoglobin: 12.9 g/dL — ABNORMAL LOW (ref 13.0–17.0)
Immature Granulocytes: 0 %
Lymphocytes Relative: 25 %
Lymphs Abs: 2.5 10*3/uL (ref 0.7–4.0)
MCH: 31.4 pg (ref 26.0–34.0)
MCHC: 33.2 g/dL (ref 30.0–36.0)
MCV: 94.6 fL (ref 80.0–100.0)
Monocytes Absolute: 0.6 10*3/uL (ref 0.1–1.0)
Monocytes Relative: 6 %
Neutro Abs: 6.7 10*3/uL (ref 1.7–7.7)
Neutrophils Relative %: 66 %
Platelets: 185 10*3/uL (ref 150–400)
RBC: 4.11 MIL/uL — ABNORMAL LOW (ref 4.22–5.81)
RDW: 12.8 % (ref 11.5–15.5)
WBC: 10.1 10*3/uL (ref 4.0–10.5)
nRBC: 0 % (ref 0.0–0.2)

## 2020-08-05 LAB — URINALYSIS, ROUTINE W REFLEX MICROSCOPIC
Bacteria, UA: NONE SEEN
Bilirubin Urine: NEGATIVE
Glucose, UA: >=500 mg/dL — AB
Hgb urine dipstick: NEGATIVE
Ketones, ur: 20 mg/dL — AB
Leukocytes,Ua: NEGATIVE
Nitrite: NEGATIVE
Protein, ur: NEGATIVE mg/dL
Specific Gravity, Urine: 1.026 (ref 1.005–1.030)
pH: 5 (ref 5.0–8.0)

## 2020-08-05 LAB — CBG MONITORING, ED: Glucose-Capillary: 238 mg/dL — ABNORMAL HIGH (ref 70–99)

## 2020-08-05 NOTE — ED Notes (Signed)
Pt able to state name and denies any pain when asked.  Pt not able to state place or date, pt not able to state what happened or as to why he is here when asked.

## 2020-08-05 NOTE — ED Provider Notes (Signed)
[facility] EMERGENCY DEPARTMENT Provider Note   CSN: [id number] Arrival date & time: 08/05/20  1745     History Chief Complaint  Patient presents with  . Fall    Derek Hunter is a 64 y.o. male presenitng for evaluation of progressive mental decline.  Level 5 caveat due to dementia.  History provided by staff at [facility].  They state patient fell on the 31st, 9 days ago.  Since then, he has had significant decline in cognition.  He is much slower to respond to verbal stimuli and slower to follow commands.  They state patient is still ambulating, however he appears more off balance, appears to be in a daze.  Staff states patient will be walking to the dining room when he will stop in the hall, lower his pants and start to urinate.  This is atypical behavior for him.  Patient had another fall today when getting up from a chair, no obvious injuries.  Staff states patient is blood sugars have been mildly elevated, around 300, normally in the 200s.  No reported fever, cough, vomiting, complaints of chest pain, signs of shortness of breath, change in appetite, change in bowel movements.  No recent medication changes.  Patient is not on a blood thinner.  Staff states baseline mental status is A&Ox1 (person).  Additional history obtained from patient's niece, Shanda Bumps, who is with him in the ED.  She states patient seems more dazed than normal, however not too far off his baseline.  HPI     Past Medical History:  Diagnosis Date  . Achalasia of esophagus t-5  . Anxiety   . Arthritis   . Cervical stenosis of spinal canal   . Depression   . Diabetes mellitus   . GERD (gastroesophageal reflux disease)   . Hypertension 03/12/2017  . Memory loss     Patient Active Problem List   Diagnosis Date Noted  . Paroxysmal tachycardia (HCC) 03/13/2017  . Hyperlipidemia 03/13/2017  . Hypertension 03/12/2017    Past Surgical History:  Procedure Laterality Date  . ANTERIOR  CERVICAL DECOMP/DISCECTOMY FUSION N/A 03/15/2017   Procedure: Anterior Cervical Discectomy Fusion - Cervical five- Cervical six - Cervical six- Cervical seven;  Surgeon: Julio Sicks, MD;  Location: [facility] OR;  Service: Neurosurgery;  Laterality: N/A;  . APPENDECTOMY    . CATARACT EXTRACTION W/PHACO Right 07/12/2018   Procedure: CATARACT EXTRACTION PHACO AND INTRAOCULAR LENS PLACEMENT (IOC);  Surgeon: Fabio Pierce, MD;  Location: [facility] ORS;  Service: Ophthalmology;  Laterality: Right;  CDE:  8.85  . CATARACT EXTRACTION W/PHACO Left 06/28/2018   Procedure: CATARACT EXTRACTION PHACO AND INTRAOCULAR LENS PLACEMENT (IOC);  Surgeon: Fabio Pierce, MD;  Location: [facility] ORS;  Service: Ophthalmology;  Laterality: Left;  CDE: 4.25  . DIAGNOSTIC LAPAROSCOPY     " flap to keep esophagus open"  . HERNIA REPAIR         Family History  Problem Relation Age of Onset  . Valvular heart disease Mother     Social History   Tobacco Use  . Smoking status: Former Smoker    Quit date: 11/27/1986    Years since quitting: 33.7  . Smokeless tobacco: Never Used  Vaping Use  . Vaping Use: Never used  Substance Use Topics  . Alcohol use: No  . Drug use: No    Home Medications Prior to Admission medications   Medication Sig Start Date End Date Taking? Authorizing Provider  amitriptyline (ELAVIL) 25 MG tablet Take 25  mg by mouth at bedtime. 02/07/17   [provider]  baclofen (LIORESAL) 10 MG tablet Take 10 mg by mouth 2 (two) times daily.    [provider]  GLIPIZIDE XL 5 MG 24 hr tablet Take 5 mg by mouth 2 (two) times daily. 02/09/17   [provider]  Insulin Glargine (BASAGLAR KWIKPEN) 100 UNIT/ML SOPN Inject 18 Units into the skin at bedtime.    [provider]  LYRICA 50 MG capsule Take 50 mg by mouth at bedtime. 05/10/18   [provider]  metFORMIN (GLUCOPHAGE) 1000 MG tablet Take 1,000 mg by mouth 2 (two) times daily with a meal.    [provider]    metoprolol tartrate (LOPRESSOR) 25 MG tablet Take 12.5 mg by mouth 2 (two) times daily.    [provider]  omeprazole (PRILOSEC) 40 MG capsule Take 40 mg by mouth daily.  02/07/17   [provider]  oxyCODONE-acetaminophen (PERCOCET/ROXICET) 5-325 MG tablet Take 0.5 tablets by mouth 2 (two) times daily. 05/10/18   [provider]  PARoxetine (PAXIL) 20 MG tablet Take 30 mg by mouth at bedtime.     [provider]  pioglitazone (ACTOS) 15 MG tablet Take 15 mg by mouth daily.    [provider]  PRESCRIPTION MEDICATION Place 1 drop into the left eye 2 (two) times daily. Imprimis Eye Drop (Prednisolone, Gatifloxacin and Bromfenac)     [provider]  simvastatin (ZOCOR) 20 MG tablet Take 20 mg by mouth every evening.     [provider]    Allergies    Penicillins  Review of Systems   Review of Systems  Unable to perform ROS: Dementia  Musculoskeletal: Positive for gait problem.  Psychiatric/Behavioral: Positive for confusion.    Physical Exam Updated Vital Signs BP (!) 158/81 (BP Location: Left Arm)   Pulse 82   Temp 98.1 F (36.7 C) (Oral)   Resp 16   SpO2 96%   Physical Exam Vitals and nursing note reviewed.  Constitutional:      Appearance: He is well-developed.     Comments: Appears nontoxic. Very confused  HENT:     Head: Normocephalic and atraumatic.     Comments: No signs of head trauma. No hemotympanum or nasal septal hematoma.  Eyes:     Conjunctiva/sclera: Conjunctivae normal.     Pupils: Pupils are equal, round, and reactive to light.     Comments: Will not perform EOMs.   Neck:     Comments: No ttp midline c-spine. No step offs or deformities. Cardiovascular:     Rate and Rhythm: Normal rate and regular rhythm.     Pulses: Normal pulses.  Pulmonary:     Effort: Pulmonary effort is normal. No respiratory distress.     Breath sounds: Normal breath sounds. No wheezing.  Abdominal:     General:  There is no distension.     Palpations: Abdomen is soft. There is no mass.     Tenderness: There is no abdominal tenderness. There is no guarding or rebound.  Musculoskeletal:        General: Normal range of motion.     Cervical back: Normal range of motion and neck supple.     Comments: Strength of upper and lower ext equal bilaterally, but weak generally. Radial and pedal pulse 2+ bilaterally.   Skin:    General: Skin is warm and dry.     Capillary Refill: Capillary refill takes less than 2 seconds.  Neurological:     Mental Status: He is confused.     GCS: GCS eye subscore is 4. GCS verbal subscore is 5. GCS motor subscore is 6.     Comments: A&Ox1 (baseline). Slow to follow commands, but able to do so. No gross numbness.      ED Results / Procedures / Treatments   Labs (all labs ordered are listed, but only abnormal results are displayed) Labs Reviewed - No data to display  EKG None  Radiology No results found.  Procedures Procedures (including critical care time)  Medications Ordered in ED Medications - No data to display  ED Course  I have reviewed the triage vital signs and the nursing notes.  Pertinent labs & imaging results that were available during my care of the patient were reviewed by me and considered in my medical decision making (see chart for details).    MDM Rules/Calculators/A&P                          Patient presenting for evaluation of gradual cognitive decline and increased gait instability.  History and physical limited due to patient's history of dementia.  On exam, patient appears in a daze, however does not appear acutely ill or in distress.  Per niece, this is not far from patient's baseline.  However due to reported increasing confusion and gait difficulty, will obtain labs, urine, and head CT.  Labs interpreted by me, overall reassuring.  Glucose elevated in the 250s, however no sign of DKA.  Electrolytes are stable.  Hemoglobin stable.  CT  head negative for acute findings such as bleed, swelling, or mass.  EKG nonischemic.  Patient pending UA.  I discussed findings at this point with patient's niece, Victorino Dike, who is at bedside.  Discussed that we are waiting for a urine sample, and if normal, patient can likely be discharged back to his facility with PCP follow-up as needed.  She is agreeable to plan.  Pt signed out to Barry Brunner, PA-C for f/u on UA. Likely d/c back to facility.  Final Clinical Impression(s) / ED Diagnoses Final diagnoses:  None    Rx / DC Orders ED Discharge Orders    None       Alveria Apley, PA-C 08/05/20 Loma Sender, MD 08/05/20 [id number]

## 2020-08-05 NOTE — ED Triage Notes (Signed)
Pt fell on 8/31 and reported that pt hit his head on the floor where pt resides at [facility].  Reported that pt has dementia and that family did not want pt evaluated at that time.  Reported that pt decrease in cognition and increase in incont.  Sent here for CT.

## 2020-08-05 NOTE — Discharge Instructions (Addendum)
Continue taking home medications as prescribed.  Your urinalysis was unremarkable with no signs of infection.  Overall you have a very reassuring work-up today. Follow up with your primary care doctor as needed for further evaluation.  Return to the ER with any new, worsening, or concerning symptoms.

## 2020-08-07 LAB — URINE CULTURE: Culture: NO GROWTH

## 2020-08-10 DIAGNOSIS — G309 Alzheimer's disease, unspecified: Secondary | ICD-10-CM | POA: Diagnosis not present

## 2020-08-10 DIAGNOSIS — Z79899 Other long term (current) drug therapy: Secondary | ICD-10-CM | POA: Diagnosis not present

## 2020-08-10 DIAGNOSIS — E119 Type 2 diabetes mellitus without complications: Secondary | ICD-10-CM | POA: Diagnosis not present

## 2020-08-11 DIAGNOSIS — G309 Alzheimer's disease, unspecified: Secondary | ICD-10-CM | POA: Diagnosis not present

## 2020-08-12 DIAGNOSIS — G309 Alzheimer's disease, unspecified: Secondary | ICD-10-CM | POA: Diagnosis not present

## 2020-08-13 DIAGNOSIS — G309 Alzheimer's disease, unspecified: Secondary | ICD-10-CM | POA: Diagnosis not present

## 2020-08-16 DIAGNOSIS — G309 Alzheimer's disease, unspecified: Secondary | ICD-10-CM | POA: Diagnosis not present

## 2020-08-17 DIAGNOSIS — G309 Alzheimer's disease, unspecified: Secondary | ICD-10-CM | POA: Diagnosis not present

## 2020-08-18 DIAGNOSIS — G309 Alzheimer's disease, unspecified: Secondary | ICD-10-CM | POA: Diagnosis not present

## 2020-08-19 DIAGNOSIS — G309 Alzheimer's disease, unspecified: Secondary | ICD-10-CM | POA: Diagnosis not present

## 2020-08-20 DIAGNOSIS — G309 Alzheimer's disease, unspecified: Secondary | ICD-10-CM | POA: Diagnosis not present

## 2020-08-23 DIAGNOSIS — G309 Alzheimer's disease, unspecified: Secondary | ICD-10-CM | POA: Diagnosis not present

## 2020-08-24 DIAGNOSIS — E119 Type 2 diabetes mellitus without complications: Secondary | ICD-10-CM | POA: Diagnosis not present

## 2020-08-24 DIAGNOSIS — M62838 Other muscle spasm: Secondary | ICD-10-CM | POA: Diagnosis not present

## 2020-08-24 DIAGNOSIS — G309 Alzheimer's disease, unspecified: Secondary | ICD-10-CM | POA: Diagnosis not present

## 2020-08-24 DIAGNOSIS — Z794 Long term (current) use of insulin: Secondary | ICD-10-CM | POA: Diagnosis not present

## 2020-08-25 DIAGNOSIS — G309 Alzheimer's disease, unspecified: Secondary | ICD-10-CM | POA: Diagnosis not present

## 2020-08-26 DIAGNOSIS — G309 Alzheimer's disease, unspecified: Secondary | ICD-10-CM | POA: Diagnosis not present

## 2020-08-27 DIAGNOSIS — G309 Alzheimer's disease, unspecified: Secondary | ICD-10-CM | POA: Diagnosis not present

## 2020-08-30 DIAGNOSIS — G309 Alzheimer's disease, unspecified: Secondary | ICD-10-CM | POA: Diagnosis not present

## 2020-08-31 DIAGNOSIS — M549 Dorsalgia, unspecified: Secondary | ICD-10-CM | POA: Diagnosis not present

## 2020-08-31 DIAGNOSIS — M62838 Other muscle spasm: Secondary | ICD-10-CM | POA: Diagnosis not present

## 2020-08-31 DIAGNOSIS — G8929 Other chronic pain: Secondary | ICD-10-CM | POA: Diagnosis not present

## 2020-08-31 DIAGNOSIS — G309 Alzheimer's disease, unspecified: Secondary | ICD-10-CM | POA: Diagnosis not present

## 2020-09-14 DIAGNOSIS — E1142 Type 2 diabetes mellitus with diabetic polyneuropathy: Secondary | ICD-10-CM | POA: Diagnosis not present

## 2020-09-14 DIAGNOSIS — Z79899 Other long term (current) drug therapy: Secondary | ICD-10-CM | POA: Diagnosis not present

## 2020-09-15 DIAGNOSIS — Z23 Encounter for immunization: Secondary | ICD-10-CM | POA: Diagnosis not present

## 2020-09-17 DIAGNOSIS — F200 Paranoid schizophrenia: Secondary | ICD-10-CM | POA: Diagnosis not present

## 2020-09-17 DIAGNOSIS — F064 Anxiety disorder due to known physiological condition: Secondary | ICD-10-CM | POA: Diagnosis not present

## 2020-09-17 DIAGNOSIS — F5109 Other insomnia not due to a substance or known physiological condition: Secondary | ICD-10-CM | POA: Diagnosis not present

## 2020-09-17 DIAGNOSIS — F331 Major depressive disorder, recurrent, moderate: Secondary | ICD-10-CM | POA: Diagnosis not present

## 2020-09-17 DIAGNOSIS — F0391 Unspecified dementia with behavioral disturbance: Secondary | ICD-10-CM | POA: Diagnosis not present

## 2020-10-12 DIAGNOSIS — E119 Type 2 diabetes mellitus without complications: Secondary | ICD-10-CM | POA: Diagnosis not present

## 2020-10-12 DIAGNOSIS — H524 Presbyopia: Secondary | ICD-10-CM | POA: Diagnosis not present

## 2020-10-12 DIAGNOSIS — Z961 Presence of intraocular lens: Secondary | ICD-10-CM | POA: Diagnosis not present

## 2020-10-13 DIAGNOSIS — K219 Gastro-esophageal reflux disease without esophagitis: Secondary | ICD-10-CM | POA: Diagnosis not present

## 2020-10-13 DIAGNOSIS — E119 Type 2 diabetes mellitus without complications: Secondary | ICD-10-CM | POA: Diagnosis not present

## 2020-10-13 DIAGNOSIS — I10 Essential (primary) hypertension: Secondary | ICD-10-CM | POA: Diagnosis not present

## 2020-10-13 DIAGNOSIS — G309 Alzheimer's disease, unspecified: Secondary | ICD-10-CM | POA: Diagnosis not present

## 2020-10-15 DIAGNOSIS — Z23 Encounter for immunization: Secondary | ICD-10-CM | POA: Diagnosis not present

## 2020-10-20 DIAGNOSIS — G301 Alzheimer's disease with late onset: Secondary | ICD-10-CM | POA: Diagnosis not present

## 2020-10-20 DIAGNOSIS — F200 Paranoid schizophrenia: Secondary | ICD-10-CM | POA: Diagnosis not present

## 2020-10-20 DIAGNOSIS — F064 Anxiety disorder due to known physiological condition: Secondary | ICD-10-CM | POA: Diagnosis not present

## 2020-10-26 DIAGNOSIS — Z79899 Other long term (current) drug therapy: Secondary | ICD-10-CM | POA: Diagnosis not present

## 2020-10-26 DIAGNOSIS — E1142 Type 2 diabetes mellitus with diabetic polyneuropathy: Secondary | ICD-10-CM | POA: Diagnosis not present

## 2020-10-26 DIAGNOSIS — F419 Anxiety disorder, unspecified: Secondary | ICD-10-CM | POA: Diagnosis not present

## 2020-10-26 DIAGNOSIS — M6281 Muscle weakness (generalized): Secondary | ICD-10-CM | POA: Diagnosis not present

## 2020-10-27 DIAGNOSIS — M6281 Muscle weakness (generalized): Secondary | ICD-10-CM | POA: Diagnosis not present

## 2020-10-27 DIAGNOSIS — E119 Type 2 diabetes mellitus without complications: Secondary | ICD-10-CM | POA: Diagnosis not present

## 2020-10-28 DIAGNOSIS — M6281 Muscle weakness (generalized): Secondary | ICD-10-CM | POA: Diagnosis not present

## 2020-10-29 DIAGNOSIS — M6281 Muscle weakness (generalized): Secondary | ICD-10-CM | POA: Diagnosis not present

## 2020-11-01 DIAGNOSIS — M6281 Muscle weakness (generalized): Secondary | ICD-10-CM | POA: Diagnosis not present

## 2020-11-02 DIAGNOSIS — M6281 Muscle weakness (generalized): Secondary | ICD-10-CM | POA: Diagnosis not present

## 2020-11-03 DIAGNOSIS — M6281 Muscle weakness (generalized): Secondary | ICD-10-CM | POA: Diagnosis not present

## 2020-11-04 DIAGNOSIS — M6281 Muscle weakness (generalized): Secondary | ICD-10-CM | POA: Diagnosis not present

## 2020-11-05 DIAGNOSIS — M6281 Muscle weakness (generalized): Secondary | ICD-10-CM | POA: Diagnosis not present

## 2020-11-08 DIAGNOSIS — M6281 Muscle weakness (generalized): Secondary | ICD-10-CM | POA: Diagnosis not present

## 2020-11-09 DIAGNOSIS — M6281 Muscle weakness (generalized): Secondary | ICD-10-CM | POA: Diagnosis not present

## 2020-11-10 DIAGNOSIS — M6281 Muscle weakness (generalized): Secondary | ICD-10-CM | POA: Diagnosis not present

## 2020-11-11 DIAGNOSIS — G4701 Insomnia due to medical condition: Secondary | ICD-10-CM | POA: Diagnosis not present

## 2020-11-11 DIAGNOSIS — F064 Anxiety disorder due to known physiological condition: Secondary | ICD-10-CM | POA: Diagnosis not present

## 2020-11-11 DIAGNOSIS — F200 Paranoid schizophrenia: Secondary | ICD-10-CM | POA: Diagnosis not present

## 2020-11-11 DIAGNOSIS — M6281 Muscle weakness (generalized): Secondary | ICD-10-CM | POA: Diagnosis not present

## 2020-11-11 DIAGNOSIS — G301 Alzheimer's disease with late onset: Secondary | ICD-10-CM | POA: Diagnosis not present

## 2020-11-11 DIAGNOSIS — F0281 Dementia in other diseases classified elsewhere with behavioral disturbance: Secondary | ICD-10-CM | POA: Diagnosis not present

## 2020-11-12 DIAGNOSIS — M6281 Muscle weakness (generalized): Secondary | ICD-10-CM | POA: Diagnosis not present

## 2020-11-15 DIAGNOSIS — M6281 Muscle weakness (generalized): Secondary | ICD-10-CM | POA: Diagnosis not present

## 2020-11-16 DIAGNOSIS — Z79899 Other long term (current) drug therapy: Secondary | ICD-10-CM | POA: Diagnosis not present

## 2020-11-16 DIAGNOSIS — M6281 Muscle weakness (generalized): Secondary | ICD-10-CM | POA: Diagnosis not present

## 2020-11-16 DIAGNOSIS — Z794 Long term (current) use of insulin: Secondary | ICD-10-CM | POA: Diagnosis not present

## 2020-11-16 DIAGNOSIS — G629 Polyneuropathy, unspecified: Secondary | ICD-10-CM | POA: Diagnosis not present

## 2020-11-16 DIAGNOSIS — E1165 Type 2 diabetes mellitus with hyperglycemia: Secondary | ICD-10-CM | POA: Diagnosis not present

## 2020-11-17 DIAGNOSIS — M6281 Muscle weakness (generalized): Secondary | ICD-10-CM | POA: Diagnosis not present

## 2020-11-18 DIAGNOSIS — M6281 Muscle weakness (generalized): Secondary | ICD-10-CM | POA: Diagnosis not present

## 2020-11-22 DIAGNOSIS — M6281 Muscle weakness (generalized): Secondary | ICD-10-CM | POA: Diagnosis not present

## 2020-11-24 DIAGNOSIS — F064 Anxiety disorder due to known physiological condition: Secondary | ICD-10-CM | POA: Diagnosis not present

## 2020-11-24 DIAGNOSIS — G4701 Insomnia due to medical condition: Secondary | ICD-10-CM | POA: Diagnosis not present

## 2020-11-24 DIAGNOSIS — F0281 Dementia in other diseases classified elsewhere with behavioral disturbance: Secondary | ICD-10-CM | POA: Diagnosis not present

## 2020-11-24 DIAGNOSIS — M6281 Muscle weakness (generalized): Secondary | ICD-10-CM | POA: Diagnosis not present

## 2020-11-25 DIAGNOSIS — M6281 Muscle weakness (generalized): Secondary | ICD-10-CM | POA: Diagnosis not present

## 2020-11-26 DIAGNOSIS — M6281 Muscle weakness (generalized): Secondary | ICD-10-CM | POA: Diagnosis not present

## 2020-11-27 DIAGNOSIS — M6281 Muscle weakness (generalized): Secondary | ICD-10-CM | POA: Diagnosis not present

## 2020-11-29 DIAGNOSIS — M6281 Muscle weakness (generalized): Secondary | ICD-10-CM | POA: Diagnosis not present

## 2020-11-30 DIAGNOSIS — M6281 Muscle weakness (generalized): Secondary | ICD-10-CM | POA: Diagnosis not present

## 2020-12-01 DIAGNOSIS — M6281 Muscle weakness (generalized): Secondary | ICD-10-CM | POA: Diagnosis not present

## 2020-12-02 DIAGNOSIS — M6281 Muscle weakness (generalized): Secondary | ICD-10-CM | POA: Diagnosis not present

## 2020-12-03 DIAGNOSIS — M6281 Muscle weakness (generalized): Secondary | ICD-10-CM | POA: Diagnosis not present

## 2020-12-05 DIAGNOSIS — M6281 Muscle weakness (generalized): Secondary | ICD-10-CM | POA: Diagnosis not present

## 2020-12-06 DIAGNOSIS — M6281 Muscle weakness (generalized): Secondary | ICD-10-CM | POA: Diagnosis not present

## 2020-12-08 DIAGNOSIS — M6281 Muscle weakness (generalized): Secondary | ICD-10-CM | POA: Diagnosis not present

## 2020-12-13 DIAGNOSIS — M6281 Muscle weakness (generalized): Secondary | ICD-10-CM | POA: Diagnosis not present

## 2020-12-15 DIAGNOSIS — R0981 Nasal congestion: Secondary | ICD-10-CM | POA: Diagnosis not present

## 2020-12-15 DIAGNOSIS — M6281 Muscle weakness (generalized): Secondary | ICD-10-CM | POA: Diagnosis not present

## 2020-12-17 DIAGNOSIS — M6281 Muscle weakness (generalized): Secondary | ICD-10-CM | POA: Diagnosis not present

## 2020-12-19 DIAGNOSIS — M6281 Muscle weakness (generalized): Secondary | ICD-10-CM | POA: Diagnosis not present

## 2020-12-21 DIAGNOSIS — G629 Polyneuropathy, unspecified: Secondary | ICD-10-CM | POA: Diagnosis not present

## 2020-12-21 DIAGNOSIS — M6281 Muscle weakness (generalized): Secondary | ICD-10-CM | POA: Diagnosis not present

## 2020-12-23 DIAGNOSIS — M6281 Muscle weakness (generalized): Secondary | ICD-10-CM | POA: Diagnosis not present

## 2020-12-29 DIAGNOSIS — M6281 Muscle weakness (generalized): Secondary | ICD-10-CM | POA: Diagnosis not present

## 2020-12-29 DIAGNOSIS — R0981 Nasal congestion: Secondary | ICD-10-CM | POA: Diagnosis not present

## 2020-12-30 DIAGNOSIS — M6281 Muscle weakness (generalized): Secondary | ICD-10-CM | POA: Diagnosis not present

## 2020-12-31 DIAGNOSIS — M6281 Muscle weakness (generalized): Secondary | ICD-10-CM | POA: Diagnosis not present

## 2021-01-03 DIAGNOSIS — M6281 Muscle weakness (generalized): Secondary | ICD-10-CM | POA: Diagnosis not present

## 2021-01-04 DIAGNOSIS — M6281 Muscle weakness (generalized): Secondary | ICD-10-CM | POA: Diagnosis not present

## 2021-01-06 DIAGNOSIS — F064 Anxiety disorder due to known physiological condition: Secondary | ICD-10-CM | POA: Diagnosis not present

## 2021-01-06 DIAGNOSIS — G4701 Insomnia due to medical condition: Secondary | ICD-10-CM | POA: Diagnosis not present

## 2021-01-06 DIAGNOSIS — F0281 Dementia in other diseases classified elsewhere with behavioral disturbance: Secondary | ICD-10-CM | POA: Diagnosis not present

## 2021-01-06 DIAGNOSIS — F339 Major depressive disorder, recurrent, unspecified: Secondary | ICD-10-CM | POA: Diagnosis not present

## 2021-01-06 DIAGNOSIS — G301 Alzheimer's disease with late onset: Secondary | ICD-10-CM | POA: Diagnosis not present

## 2021-01-24 DIAGNOSIS — Z03818 Encounter for observation for suspected exposure to other biological agents ruled out: Secondary | ICD-10-CM | POA: Diagnosis not present

## 2021-01-24 DIAGNOSIS — E119 Type 2 diabetes mellitus without complications: Secondary | ICD-10-CM | POA: Diagnosis not present

## 2021-01-24 DIAGNOSIS — G8929 Other chronic pain: Secondary | ICD-10-CM | POA: Diagnosis not present

## 2021-01-24 DIAGNOSIS — E785 Hyperlipidemia, unspecified: Secondary | ICD-10-CM | POA: Diagnosis not present

## 2021-01-24 DIAGNOSIS — E1142 Type 2 diabetes mellitus with diabetic polyneuropathy: Secondary | ICD-10-CM | POA: Diagnosis not present

## 2021-01-24 DIAGNOSIS — H524 Presbyopia: Secondary | ICD-10-CM | POA: Diagnosis not present

## 2021-01-24 DIAGNOSIS — F209 Schizophrenia, unspecified: Secondary | ICD-10-CM | POA: Diagnosis not present

## 2021-01-24 DIAGNOSIS — F0281 Dementia in other diseases classified elsewhere with behavioral disturbance: Secondary | ICD-10-CM | POA: Diagnosis not present

## 2021-01-24 DIAGNOSIS — U071 COVID-19: Secondary | ICD-10-CM | POA: Diagnosis not present

## 2021-01-24 DIAGNOSIS — Z794 Long term (current) use of insulin: Secondary | ICD-10-CM | POA: Diagnosis not present

## 2021-01-24 DIAGNOSIS — G4700 Insomnia, unspecified: Secondary | ICD-10-CM | POA: Diagnosis not present

## 2021-01-24 DIAGNOSIS — Z20828 Contact with and (suspected) exposure to other viral communicable diseases: Secondary | ICD-10-CM | POA: Diagnosis not present

## 2021-01-24 DIAGNOSIS — E559 Vitamin D deficiency, unspecified: Secondary | ICD-10-CM | POA: Diagnosis not present

## 2021-01-24 DIAGNOSIS — K219 Gastro-esophageal reflux disease without esophagitis: Secondary | ICD-10-CM | POA: Diagnosis not present

## 2021-01-24 DIAGNOSIS — R0981 Nasal congestion: Secondary | ICD-10-CM | POA: Diagnosis not present

## 2021-01-24 DIAGNOSIS — F339 Major depressive disorder, recurrent, unspecified: Secondary | ICD-10-CM | POA: Diagnosis not present

## 2021-01-24 DIAGNOSIS — F419 Anxiety disorder, unspecified: Secondary | ICD-10-CM | POA: Diagnosis not present

## 2021-01-24 DIAGNOSIS — E1165 Type 2 diabetes mellitus with hyperglycemia: Secondary | ICD-10-CM | POA: Diagnosis not present

## 2021-01-24 DIAGNOSIS — G309 Alzheimer's disease, unspecified: Secondary | ICD-10-CM | POA: Diagnosis not present

## 2021-01-24 DIAGNOSIS — I10 Essential (primary) hypertension: Secondary | ICD-10-CM | POA: Diagnosis not present

## 2021-01-24 DIAGNOSIS — M62838 Other muscle spasm: Secondary | ICD-10-CM | POA: Diagnosis not present

## 2021-01-25 DIAGNOSIS — Z794 Long term (current) use of insulin: Secondary | ICD-10-CM | POA: Diagnosis not present

## 2021-01-25 DIAGNOSIS — E119 Type 2 diabetes mellitus without complications: Secondary | ICD-10-CM | POA: Diagnosis not present

## 2021-01-25 DIAGNOSIS — U071 COVID-19: Secondary | ICD-10-CM | POA: Diagnosis not present

## 2021-02-01 DIAGNOSIS — U071 COVID-19: Secondary | ICD-10-CM | POA: Diagnosis not present

## 2021-02-07 DIAGNOSIS — E119 Type 2 diabetes mellitus without complications: Secondary | ICD-10-CM | POA: Diagnosis not present

## 2021-02-07 DIAGNOSIS — E785 Hyperlipidemia, unspecified: Secondary | ICD-10-CM | POA: Diagnosis not present

## 2021-02-07 DIAGNOSIS — Z79899 Other long term (current) drug therapy: Secondary | ICD-10-CM | POA: Diagnosis not present

## 2021-02-08 DIAGNOSIS — I10 Essential (primary) hypertension: Secondary | ICD-10-CM | POA: Diagnosis not present

## 2021-02-08 DIAGNOSIS — Z794 Long term (current) use of insulin: Secondary | ICD-10-CM | POA: Diagnosis not present

## 2021-02-08 DIAGNOSIS — E119 Type 2 diabetes mellitus without complications: Secondary | ICD-10-CM | POA: Diagnosis not present

## 2021-02-16 DIAGNOSIS — F339 Major depressive disorder, recurrent, unspecified: Secondary | ICD-10-CM | POA: Diagnosis not present

## 2021-02-16 DIAGNOSIS — G4701 Insomnia due to medical condition: Secondary | ICD-10-CM | POA: Diagnosis not present

## 2021-02-16 DIAGNOSIS — G301 Alzheimer's disease with late onset: Secondary | ICD-10-CM | POA: Diagnosis not present

## 2021-02-16 DIAGNOSIS — F062 Psychotic disorder with delusions due to known physiological condition: Secondary | ICD-10-CM | POA: Diagnosis not present

## 2021-02-16 DIAGNOSIS — F0281 Dementia in other diseases classified elsewhere with behavioral disturbance: Secondary | ICD-10-CM | POA: Diagnosis not present

## 2021-02-16 DIAGNOSIS — F064 Anxiety disorder due to known physiological condition: Secondary | ICD-10-CM | POA: Diagnosis not present

## 2021-02-22 DIAGNOSIS — E1142 Type 2 diabetes mellitus with diabetic polyneuropathy: Secondary | ICD-10-CM | POA: Diagnosis not present

## 2021-02-22 DIAGNOSIS — E119 Type 2 diabetes mellitus without complications: Secondary | ICD-10-CM | POA: Diagnosis not present

## 2021-03-01 DIAGNOSIS — B370 Candidal stomatitis: Secondary | ICD-10-CM | POA: Diagnosis not present

## 2021-03-08 DIAGNOSIS — Z79899 Other long term (current) drug therapy: Secondary | ICD-10-CM | POA: Diagnosis not present

## 2021-03-08 DIAGNOSIS — F419 Anxiety disorder, unspecified: Secondary | ICD-10-CM | POA: Diagnosis not present

## 2021-03-09 DIAGNOSIS — F209 Schizophrenia, unspecified: Secondary | ICD-10-CM | POA: Diagnosis not present

## 2021-03-09 DIAGNOSIS — G309 Alzheimer's disease, unspecified: Secondary | ICD-10-CM | POA: Diagnosis not present

## 2021-03-09 DIAGNOSIS — I10 Essential (primary) hypertension: Secondary | ICD-10-CM | POA: Diagnosis not present

## 2021-03-09 DIAGNOSIS — E119 Type 2 diabetes mellitus without complications: Secondary | ICD-10-CM | POA: Diagnosis not present

## 2021-03-22 DIAGNOSIS — F419 Anxiety disorder, unspecified: Secondary | ICD-10-CM | POA: Diagnosis not present

## 2021-03-22 DIAGNOSIS — Z79899 Other long term (current) drug therapy: Secondary | ICD-10-CM | POA: Diagnosis not present

## 2021-03-22 DIAGNOSIS — G629 Polyneuropathy, unspecified: Secondary | ICD-10-CM | POA: Diagnosis not present

## 2021-03-24 DIAGNOSIS — E1165 Type 2 diabetes mellitus with hyperglycemia: Secondary | ICD-10-CM | POA: Diagnosis not present

## 2021-04-08 DIAGNOSIS — R262 Difficulty in walking, not elsewhere classified: Secondary | ICD-10-CM | POA: Diagnosis not present

## 2021-04-08 DIAGNOSIS — G309 Alzheimer's disease, unspecified: Secondary | ICD-10-CM | POA: Diagnosis not present

## 2021-04-08 DIAGNOSIS — M6281 Muscle weakness (generalized): Secondary | ICD-10-CM | POA: Diagnosis not present

## 2021-04-11 DIAGNOSIS — R262 Difficulty in walking, not elsewhere classified: Secondary | ICD-10-CM | POA: Diagnosis not present

## 2021-04-11 DIAGNOSIS — G309 Alzheimer's disease, unspecified: Secondary | ICD-10-CM | POA: Diagnosis not present

## 2021-04-11 DIAGNOSIS — M6281 Muscle weakness (generalized): Secondary | ICD-10-CM | POA: Diagnosis not present

## 2021-04-12 DIAGNOSIS — R262 Difficulty in walking, not elsewhere classified: Secondary | ICD-10-CM | POA: Diagnosis not present

## 2021-04-12 DIAGNOSIS — M6281 Muscle weakness (generalized): Secondary | ICD-10-CM | POA: Diagnosis not present

## 2021-04-12 DIAGNOSIS — G309 Alzheimer's disease, unspecified: Secondary | ICD-10-CM | POA: Diagnosis not present

## 2021-04-13 DIAGNOSIS — M6281 Muscle weakness (generalized): Secondary | ICD-10-CM | POA: Diagnosis not present

## 2021-04-13 DIAGNOSIS — R262 Difficulty in walking, not elsewhere classified: Secondary | ICD-10-CM | POA: Diagnosis not present

## 2021-04-13 DIAGNOSIS — R531 Weakness: Secondary | ICD-10-CM | POA: Diagnosis not present

## 2021-04-13 DIAGNOSIS — G309 Alzheimer's disease, unspecified: Secondary | ICD-10-CM | POA: Diagnosis not present

## 2021-04-14 DIAGNOSIS — G309 Alzheimer's disease, unspecified: Secondary | ICD-10-CM | POA: Diagnosis not present

## 2021-04-14 DIAGNOSIS — M6281 Muscle weakness (generalized): Secondary | ICD-10-CM | POA: Diagnosis not present

## 2021-04-14 DIAGNOSIS — R262 Difficulty in walking, not elsewhere classified: Secondary | ICD-10-CM | POA: Diagnosis not present

## 2021-04-15 DIAGNOSIS — G309 Alzheimer's disease, unspecified: Secondary | ICD-10-CM | POA: Diagnosis not present

## 2021-04-15 DIAGNOSIS — R262 Difficulty in walking, not elsewhere classified: Secondary | ICD-10-CM | POA: Diagnosis not present

## 2021-04-15 DIAGNOSIS — M6281 Muscle weakness (generalized): Secondary | ICD-10-CM | POA: Diagnosis not present

## 2021-04-18 DIAGNOSIS — R262 Difficulty in walking, not elsewhere classified: Secondary | ICD-10-CM | POA: Diagnosis not present

## 2021-04-18 DIAGNOSIS — M6281 Muscle weakness (generalized): Secondary | ICD-10-CM | POA: Diagnosis not present

## 2021-04-18 DIAGNOSIS — G309 Alzheimer's disease, unspecified: Secondary | ICD-10-CM | POA: Diagnosis not present

## 2021-04-19 DIAGNOSIS — R262 Difficulty in walking, not elsewhere classified: Secondary | ICD-10-CM | POA: Diagnosis not present

## 2021-04-19 DIAGNOSIS — G629 Polyneuropathy, unspecified: Secondary | ICD-10-CM | POA: Diagnosis not present

## 2021-04-19 DIAGNOSIS — G309 Alzheimer's disease, unspecified: Secondary | ICD-10-CM | POA: Diagnosis not present

## 2021-04-19 DIAGNOSIS — M6281 Muscle weakness (generalized): Secondary | ICD-10-CM | POA: Diagnosis not present

## 2021-04-19 DIAGNOSIS — Z79899 Other long term (current) drug therapy: Secondary | ICD-10-CM | POA: Diagnosis not present

## 2021-04-19 DIAGNOSIS — F419 Anxiety disorder, unspecified: Secondary | ICD-10-CM | POA: Diagnosis not present

## 2021-04-20 DIAGNOSIS — G309 Alzheimer's disease, unspecified: Secondary | ICD-10-CM | POA: Diagnosis not present

## 2021-04-20 DIAGNOSIS — R262 Difficulty in walking, not elsewhere classified: Secondary | ICD-10-CM | POA: Diagnosis not present

## 2021-04-20 DIAGNOSIS — M6281 Muscle weakness (generalized): Secondary | ICD-10-CM | POA: Diagnosis not present

## 2021-04-21 DIAGNOSIS — R262 Difficulty in walking, not elsewhere classified: Secondary | ICD-10-CM | POA: Diagnosis not present

## 2021-04-21 DIAGNOSIS — G309 Alzheimer's disease, unspecified: Secondary | ICD-10-CM | POA: Diagnosis not present

## 2021-04-21 DIAGNOSIS — M6281 Muscle weakness (generalized): Secondary | ICD-10-CM | POA: Diagnosis not present

## 2021-04-22 DIAGNOSIS — R262 Difficulty in walking, not elsewhere classified: Secondary | ICD-10-CM | POA: Diagnosis not present

## 2021-04-22 DIAGNOSIS — G4701 Insomnia due to medical condition: Secondary | ICD-10-CM | POA: Diagnosis not present

## 2021-04-22 DIAGNOSIS — M6281 Muscle weakness (generalized): Secondary | ICD-10-CM | POA: Diagnosis not present

## 2021-04-22 DIAGNOSIS — F064 Anxiety disorder due to known physiological condition: Secondary | ICD-10-CM | POA: Diagnosis not present

## 2021-04-22 DIAGNOSIS — F331 Major depressive disorder, recurrent, moderate: Secondary | ICD-10-CM | POA: Diagnosis not present

## 2021-04-22 DIAGNOSIS — G309 Alzheimer's disease, unspecified: Secondary | ICD-10-CM | POA: Diagnosis not present

## 2021-04-22 DIAGNOSIS — G301 Alzheimer's disease with late onset: Secondary | ICD-10-CM | POA: Diagnosis not present

## 2021-04-25 DIAGNOSIS — M6281 Muscle weakness (generalized): Secondary | ICD-10-CM | POA: Diagnosis not present

## 2021-04-25 DIAGNOSIS — R262 Difficulty in walking, not elsewhere classified: Secondary | ICD-10-CM | POA: Diagnosis not present

## 2021-04-25 DIAGNOSIS — G309 Alzheimer's disease, unspecified: Secondary | ICD-10-CM | POA: Diagnosis not present

## 2021-04-26 DIAGNOSIS — G309 Alzheimer's disease, unspecified: Secondary | ICD-10-CM | POA: Diagnosis not present

## 2021-04-26 DIAGNOSIS — R262 Difficulty in walking, not elsewhere classified: Secondary | ICD-10-CM | POA: Diagnosis not present

## 2021-04-26 DIAGNOSIS — M6281 Muscle weakness (generalized): Secondary | ICD-10-CM | POA: Diagnosis not present

## 2021-04-27 DIAGNOSIS — G309 Alzheimer's disease, unspecified: Secondary | ICD-10-CM | POA: Diagnosis not present

## 2021-04-27 DIAGNOSIS — R262 Difficulty in walking, not elsewhere classified: Secondary | ICD-10-CM | POA: Diagnosis not present

## 2021-04-27 DIAGNOSIS — M6281 Muscle weakness (generalized): Secondary | ICD-10-CM | POA: Diagnosis not present

## 2021-04-28 DIAGNOSIS — M6281 Muscle weakness (generalized): Secondary | ICD-10-CM | POA: Diagnosis not present

## 2021-04-28 DIAGNOSIS — R262 Difficulty in walking, not elsewhere classified: Secondary | ICD-10-CM | POA: Diagnosis not present

## 2021-04-28 DIAGNOSIS — G309 Alzheimer's disease, unspecified: Secondary | ICD-10-CM | POA: Diagnosis not present

## 2021-04-29 DIAGNOSIS — M6281 Muscle weakness (generalized): Secondary | ICD-10-CM | POA: Diagnosis not present

## 2021-04-29 DIAGNOSIS — G309 Alzheimer's disease, unspecified: Secondary | ICD-10-CM | POA: Diagnosis not present

## 2021-04-29 DIAGNOSIS — R262 Difficulty in walking, not elsewhere classified: Secondary | ICD-10-CM | POA: Diagnosis not present

## 2021-05-02 DIAGNOSIS — R262 Difficulty in walking, not elsewhere classified: Secondary | ICD-10-CM | POA: Diagnosis not present

## 2021-05-02 DIAGNOSIS — M6281 Muscle weakness (generalized): Secondary | ICD-10-CM | POA: Diagnosis not present

## 2021-05-02 DIAGNOSIS — G309 Alzheimer's disease, unspecified: Secondary | ICD-10-CM | POA: Diagnosis not present

## 2021-05-03 DIAGNOSIS — D510 Vitamin B12 deficiency anemia due to intrinsic factor deficiency: Secondary | ICD-10-CM | POA: Diagnosis not present

## 2021-05-03 DIAGNOSIS — G309 Alzheimer's disease, unspecified: Secondary | ICD-10-CM | POA: Diagnosis not present

## 2021-05-03 DIAGNOSIS — R262 Difficulty in walking, not elsewhere classified: Secondary | ICD-10-CM | POA: Diagnosis not present

## 2021-05-03 DIAGNOSIS — Z79899 Other long term (current) drug therapy: Secondary | ICD-10-CM | POA: Diagnosis not present

## 2021-05-03 DIAGNOSIS — D649 Anemia, unspecified: Secondary | ICD-10-CM | POA: Diagnosis not present

## 2021-05-03 DIAGNOSIS — E1165 Type 2 diabetes mellitus with hyperglycemia: Secondary | ICD-10-CM | POA: Diagnosis not present

## 2021-05-03 DIAGNOSIS — M6281 Muscle weakness (generalized): Secondary | ICD-10-CM | POA: Diagnosis not present

## 2021-05-04 DIAGNOSIS — M6281 Muscle weakness (generalized): Secondary | ICD-10-CM | POA: Diagnosis not present

## 2021-05-04 DIAGNOSIS — R262 Difficulty in walking, not elsewhere classified: Secondary | ICD-10-CM | POA: Diagnosis not present

## 2021-05-04 DIAGNOSIS — G309 Alzheimer's disease, unspecified: Secondary | ICD-10-CM | POA: Diagnosis not present

## 2021-05-05 DIAGNOSIS — R262 Difficulty in walking, not elsewhere classified: Secondary | ICD-10-CM | POA: Diagnosis not present

## 2021-05-05 DIAGNOSIS — G309 Alzheimer's disease, unspecified: Secondary | ICD-10-CM | POA: Diagnosis not present

## 2021-05-05 DIAGNOSIS — M6281 Muscle weakness (generalized): Secondary | ICD-10-CM | POA: Diagnosis not present

## 2021-05-12 DIAGNOSIS — G301 Alzheimer's disease with late onset: Secondary | ICD-10-CM | POA: Diagnosis not present

## 2021-05-13 ENCOUNTER — Encounter (HOSPITAL_COMMUNITY): Payer: Self-pay

## 2021-05-13 ENCOUNTER — Other Ambulatory Visit: Payer: Self-pay

## 2021-05-13 ENCOUNTER — Emergency Department (HOSPITAL_COMMUNITY)
Admission: EM | Admit: 2021-05-13 | Discharge: 2021-05-14 | Disposition: A | Discharge status: Home / Self Care | Payer: Medicare Other | Attending: Emergency Medicine | Admitting: Emergency Medicine

## 2021-05-13 DIAGNOSIS — R456 Violent behavior: Secondary | ICD-10-CM | POA: Insufficient documentation

## 2021-05-13 DIAGNOSIS — R4689 Other symptoms and signs involving appearance and behavior: Secondary | ICD-10-CM

## 2021-05-13 DIAGNOSIS — Z79899 Other long term (current) drug therapy: Secondary | ICD-10-CM | POA: Diagnosis not present

## 2021-05-13 DIAGNOSIS — D72829 Elevated white blood cell count, unspecified: Secondary | ICD-10-CM | POA: Insufficient documentation

## 2021-05-13 DIAGNOSIS — Z87891 Personal history of nicotine dependence: Secondary | ICD-10-CM | POA: Insufficient documentation

## 2021-05-13 DIAGNOSIS — Z7984 Long term (current) use of oral hypoglycemic drugs: Secondary | ICD-10-CM | POA: Insufficient documentation

## 2021-05-13 DIAGNOSIS — I10 Essential (primary) hypertension: Secondary | ICD-10-CM | POA: Diagnosis not present

## 2021-05-13 DIAGNOSIS — Z794 Long term (current) use of insulin: Secondary | ICD-10-CM | POA: Diagnosis not present

## 2021-05-13 DIAGNOSIS — E119 Type 2 diabetes mellitus without complications: Secondary | ICD-10-CM | POA: Insufficient documentation

## 2021-05-13 DIAGNOSIS — F0390 Unspecified dementia without behavioral disturbance: Secondary | ICD-10-CM | POA: Insufficient documentation

## 2021-05-13 LAB — COMPREHENSIVE METABOLIC PANEL
ALT: 19 U/L (ref 0–44)
AST: 23 U/L (ref 15–41)
Albumin: 4.2 g/dL (ref 3.5–5.0)
Alkaline Phosphatase: 42 U/L (ref 38–126)
Anion gap: 9 (ref 5–15)
BUN: 18 mg/dL (ref 8–23)
CO2: 27 mmol/L (ref 22–32)
Calcium: 9.6 mg/dL (ref 8.9–10.3)
Chloride: 100 mmol/L (ref 98–111)
Creatinine, Ser: 1.07 mg/dL (ref 0.61–1.24)
GFR, Estimated: >60 mL/min (ref >60)
Glucose, Bld: 117 mg/dL — ABNORMAL HIGH (ref 70–99)
Potassium: 4.3 mmol/L (ref 3.5–5.1)
Sodium: 136 mmol/L (ref 135–145)
Total Bilirubin: 0.2 mg/dL — ABNORMAL LOW (ref 0.3–1.2)
Total Protein: 7.7 g/dL (ref 6.5–8.1)

## 2021-05-13 LAB — CBC WITH DIFFERENTIAL/PLATELET
Abs Immature Granulocytes: 0.03 10*3/uL (ref 0.00–0.07)
Basophils Absolute: 0.1 10*3/uL (ref 0.0–0.1)
Basophils Relative: 1 %
Eosinophils Absolute: 0.4 10*3/uL (ref 0.0–0.5)
Eosinophils Relative: 4 %
HCT: 36.6 % — ABNORMAL LOW (ref 39.0–52.0)
Hemoglobin: 12.1 g/dL — ABNORMAL LOW (ref 13.0–17.0)
Immature Granulocytes: 0 %
Lymphocytes Relative: 29 %
Lymphs Abs: 3.1 10*3/uL (ref 0.7–4.0)
MCH: 31.6 pg (ref 26.0–34.0)
MCHC: 33.1 g/dL (ref 30.0–36.0)
MCV: 95.6 fL (ref 80.0–100.0)
Monocytes Absolute: 0.8 10*3/uL (ref 0.1–1.0)
Monocytes Relative: 7 %
Neutro Abs: 6.3 10*3/uL (ref 1.7–7.7)
Neutrophils Relative %: 59 %
Platelets: ADEQUATE 10*3/uL (ref 150–400)
RBC: 3.83 MIL/uL — ABNORMAL LOW (ref 4.22–5.81)
RDW: 12.1 % (ref 11.5–15.5)
WBC: 10.7 10*3/uL — ABNORMAL HIGH (ref 4.0–10.5)
nRBC: 0 % (ref 0.0–0.2)
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
## 2021-05-13 NOTE — ED Provider Notes (Signed)
[facility] EMERGENCY DEPARTMENT Provider Note   CSN: [id number] Arrival date & time: 05/13/21  1937     History Chief Complaint  Patient presents with   Aggressive Behavior   Psychiatric Evaluation    Derek Hunter is a 65 y.o. male.  Patient with a history of dementia sent in from [facility] for being aggressive.  Patient states he was frustrated over dinner.  According to daughter that does seem to correlate.  Patient upon arrival here is been very cooperative.  Vital signs without any significant abnormalities.  Past medical history other than the dementia is significant for tachycardia hyperlipidemia hypertension.  He arrived from [facility] they were who are requesting a full psych evaluation.      Past Medical History:  Diagnosis Date   Achalasia of esophagus t-5   Anxiety    Arthritis    Cervical stenosis of spinal canal    Depression    Diabetes mellitus    GERD (gastroesophageal reflux disease)    Hypertension 03/12/2017   Memory loss     Patient Active Problem List   Diagnosis Date Noted   Paroxysmal tachycardia (HCC) 03/13/2017   Hyperlipidemia 03/13/2017   Hypertension 03/12/2017    Past Surgical History:  Procedure Laterality Date   ANTERIOR CERVICAL DECOMP/DISCECTOMY FUSION N/A 03/15/2017   Procedure: Anterior Cervical Discectomy Fusion - Cervical five- Cervical six - Cervical six- Cervical seven;  Surgeon: Julio Sicks, MD;  Location: [facility] OR;  Service: Neurosurgery;  Laterality: N/A;   APPENDECTOMY     CATARACT EXTRACTION W/PHACO Right 07/12/2018   Procedure: CATARACT EXTRACTION PHACO AND INTRAOCULAR LENS PLACEMENT (IOC);  Surgeon: Fabio Pierce, MD;  Location: [facility] ORS;  Service: Ophthalmology;  Laterality: Right;  CDE:  8.85   CATARACT EXTRACTION W/PHACO Left 06/28/2018   Procedure: CATARACT EXTRACTION PHACO AND INTRAOCULAR LENS PLACEMENT (IOC);  Surgeon: Fabio Pierce, MD;  Location: [facility] ORS;  Service: Ophthalmology;  Laterality: Left;  CDE: 4.25    DIAGNOSTIC LAPAROSCOPY     " flap to keep esophagus open"   HERNIA REPAIR         Family History  Problem Relation Age of Onset   Valvular heart disease Mother     Social History   Tobacco Use   Smoking status: Former    Pack years: 0.00    Types: Cigarettes    Quit date: 11/27/1986    Years since quitting: 34.4   Smokeless tobacco: Never  Vaping Use   Vaping Use: Never used  Substance Use Topics   Alcohol use: No   Drug use: No    Home Medications Prior to Admission medications   Medication Sig Start Date End Date Taking? Authorizing Provider  amitriptyline (ELAVIL) 25 MG tablet Take 25 mg by mouth at bedtime. 02/07/17   [provider]  baclofen (LIORESAL) 10 MG tablet Take 10 mg by mouth 2 (two) times daily.    [provider]  GLIPIZIDE XL 5 MG 24 hr tablet Take 5 mg by mouth 2 (two) times daily. 02/09/17   [provider]  Insulin Glargine (BASAGLAR KWIKPEN) 100 UNIT/ML SOPN Inject 18 Units into the skin at bedtime.    [provider]  LYRICA 50 MG capsule Take 50 mg by mouth at bedtime. 05/10/18   [provider]  metFORMIN (GLUCOPHAGE) 1000 MG tablet Take 1,000 mg by mouth 2 (two) times daily with a meal.    [provider]  metoprolol tartrate (LOPRESSOR) 25 MG tablet Take 12.5  mg by mouth 2 (two) times daily.    [provider]  omeprazole (PRILOSEC) 40 MG capsule Take 40 mg by mouth daily.  02/07/17   [provider]  oxyCODONE-acetaminophen (PERCOCET/ROXICET) 5-325 MG tablet Take 0.5 tablets by mouth 2 (two) times daily. 05/10/18   [provider]  PARoxetine (PAXIL) 20 MG tablet Take 30 mg by mouth at bedtime.     [provider]  pioglitazone (ACTOS) 15 MG tablet Take 15 mg by mouth daily.    [provider]  PRESCRIPTION MEDICATION Place 1 drop into the left eye 2 (two) times daily. Imprimis Eye Drop (Prednisolone, Gatifloxacin and Bromfenac)     [provider]   simvastatin (ZOCOR) 20 MG tablet Take 20 mg by mouth every evening.     [provider]    Allergies    Penicillins  Review of Systems   Review of Systems  Unable to perform ROS: Dementia   Physical Exam Updated Vital Signs BP 128/80   Pulse 78   Temp 98.3 F (36.8 C)   Resp 20   Wt 88 kg   SpO2 99%   BMI 26.31 kg/m   Physical Exam Vitals and nursing note reviewed.  Constitutional:      Appearance: Normal appearance. He is well-developed.  HENT:     Head: Normocephalic and atraumatic.  Eyes:     Extraocular Movements: Extraocular movements intact.     Conjunctiva/sclera: Conjunctivae normal.     Pupils: Pupils are equal, round, and reactive to light.  Cardiovascular:     Rate and Rhythm: Normal rate and regular rhythm.     Heart sounds: No murmur heard. Pulmonary:     Effort: Pulmonary effort is normal. No respiratory distress.     Breath sounds: Normal breath sounds. No wheezing.  Abdominal:     Palpations: Abdomen is soft.     Tenderness: There is no abdominal tenderness.  Musculoskeletal:        General: Normal range of motion.     Cervical back: Neck supple. No rigidity.  Skin:    General: Skin is warm and dry.  Neurological:     General: No focal deficit present.     Mental Status: He is alert. Mental status is at baseline.    ED Results / Procedures / Treatments   Labs (all labs ordered are listed, but only abnormal results are displayed) Labs Reviewed  CBC WITH DIFFERENTIAL/PLATELET - Abnormal; Notable for the following components:      Result Value   WBC 10.7 (*)    RBC 3.83 (*)    Hemoglobin 12.1 (*)    HCT 36.6 (*)    All other components within normal limits  COMPREHENSIVE METABOLIC PANEL - Abnormal; Notable for the following components:   Glucose, Bld 117 (*)    Total Bilirubin 0.2 (*)    All other components within normal limits  URINALYSIS, ROUTINE W REFLEX MICROSCOPIC  RAPID URINE DRUG SCREEN, HOSP PERFORMED     EKG None  Radiology No results found.  Procedures Procedures   Medications Ordered in ED Medications - No data to display  ED Course  I have reviewed the triage vital signs and the nursing notes.  Pertinent labs & imaging results that were available during my care of the patient were reviewed by me and considered in my medical decision making (see chart for details).    MDM Rules/Calculators/A&P  Patient's daughter is here she thinks he is at baseline.  Patient's been very cooperative for [place].  Not causing any problems.  We recontacted [facility] they are willing to take him back without full psych evaluation.  Patient's basic labs without significant abnormalities.  Other than a mild leukocytosis of 10.7. Final Clinical Impression(s) / ED Diagnoses Final diagnoses:  Aggressive behavior    Rx / DC Orders ED Discharge Orders     None        Vanetta Mulders, MD 05/13/21 2359

## 2021-05-13 NOTE — Discharge Instructions (Addendum)
Patient's been very cooperative here labs without any significant abnormality.  Vital signs normal.  Patient stable for discharge back to nursing facility.  We discussed with nursing facility they are willing to take him back.  Originally the request was for telepsych evaluation I clinically do not feel that that is necessary.  And they have agreed to take him back without that.

## 2021-05-13 NOTE — ED Notes (Signed)
Pt's daughter at bedside, requesting patient be sent back to [facility] without TTS as he has known dementia diagnosis, EDP made aware, Dr. Deretha Emory aware and feels comfortable discharging patient back to facility without TTS as he has been cooperative in ED, no agitation noted.
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
## 2021-05-13 NOTE — ED Notes (Signed)
Charge RN spoke with [name] from [facility], facility accepting patient back without TTS, daughter taking patient back to facility.

## 2021-05-18 DIAGNOSIS — G309 Alzheimer's disease, unspecified: Secondary | ICD-10-CM | POA: Diagnosis not present

## 2021-05-25 DIAGNOSIS — G301 Alzheimer's disease with late onset: Secondary | ICD-10-CM | POA: Diagnosis not present

## 2021-05-25 DIAGNOSIS — F0281 Dementia in other diseases classified elsewhere with behavioral disturbance: Secondary | ICD-10-CM | POA: Diagnosis not present

## 2021-05-28 DIAGNOSIS — M6281 Muscle weakness (generalized): Secondary | ICD-10-CM | POA: Diagnosis not present

## 2021-06-06 DIAGNOSIS — F321 Major depressive disorder, single episode, moderate: Secondary | ICD-10-CM | POA: Diagnosis not present

## 2021-06-06 DIAGNOSIS — F29 Unspecified psychosis not due to a substance or known physiological condition: Secondary | ICD-10-CM | POA: Diagnosis not present

## 2021-06-06 DIAGNOSIS — F411 Generalized anxiety disorder: Secondary | ICD-10-CM | POA: Diagnosis not present

## 2021-06-06 DIAGNOSIS — G4720 Circadian rhythm sleep disorder, unspecified type: Secondary | ICD-10-CM | POA: Diagnosis not present

## 2021-06-06 DIAGNOSIS — G300 Alzheimer's disease with early onset: Secondary | ICD-10-CM | POA: Diagnosis not present

## 2021-06-24 DIAGNOSIS — E1142 Type 2 diabetes mellitus with diabetic polyneuropathy: Secondary | ICD-10-CM | POA: Diagnosis not present

## 2021-06-24 DIAGNOSIS — Z79899 Other long term (current) drug therapy: Secondary | ICD-10-CM | POA: Diagnosis not present

## 2021-06-24 DIAGNOSIS — E119 Type 2 diabetes mellitus without complications: Secondary | ICD-10-CM | POA: Diagnosis not present

## 2021-06-29 DIAGNOSIS — F209 Schizophrenia, unspecified: Secondary | ICD-10-CM | POA: Diagnosis not present

## 2021-06-29 DIAGNOSIS — F32A Depression, unspecified: Secondary | ICD-10-CM | POA: Diagnosis not present

## 2021-06-29 DIAGNOSIS — G309 Alzheimer's disease, unspecified: Secondary | ICD-10-CM | POA: Diagnosis not present

## 2021-06-29 DIAGNOSIS — F419 Anxiety disorder, unspecified: Secondary | ICD-10-CM | POA: Diagnosis not present

## 2021-07-11 DIAGNOSIS — F321 Major depressive disorder, single episode, moderate: Secondary | ICD-10-CM | POA: Diagnosis not present

## 2021-07-11 DIAGNOSIS — G4720 Circadian rhythm sleep disorder, unspecified type: Secondary | ICD-10-CM | POA: Diagnosis not present

## 2021-07-11 DIAGNOSIS — G300 Alzheimer's disease with early onset: Secondary | ICD-10-CM | POA: Diagnosis not present

## 2021-07-11 DIAGNOSIS — G2401 Drug induced subacute dyskinesia: Secondary | ICD-10-CM | POA: Diagnosis not present

## 2021-07-11 DIAGNOSIS — F29 Unspecified psychosis not due to a substance or known physiological condition: Secondary | ICD-10-CM | POA: Diagnosis not present

## 2021-07-11 DIAGNOSIS — F411 Generalized anxiety disorder: Secondary | ICD-10-CM | POA: Diagnosis not present

## 2021-07-18 DIAGNOSIS — G2401 Drug induced subacute dyskinesia: Secondary | ICD-10-CM | POA: Diagnosis not present

## 2021-07-18 DIAGNOSIS — G300 Alzheimer's disease with early onset: Secondary | ICD-10-CM | POA: Diagnosis not present

## 2021-07-18 DIAGNOSIS — G4720 Circadian rhythm sleep disorder, unspecified type: Secondary | ICD-10-CM | POA: Diagnosis not present

## 2021-07-18 DIAGNOSIS — F29 Unspecified psychosis not due to a substance or known physiological condition: Secondary | ICD-10-CM | POA: Diagnosis not present

## 2021-07-18 DIAGNOSIS — F411 Generalized anxiety disorder: Secondary | ICD-10-CM | POA: Diagnosis not present

## 2021-07-18 DIAGNOSIS — F321 Major depressive disorder, single episode, moderate: Secondary | ICD-10-CM | POA: Diagnosis not present

## 2021-07-26 DIAGNOSIS — Z79899 Other long term (current) drug therapy: Secondary | ICD-10-CM | POA: Diagnosis not present

## 2021-07-26 DIAGNOSIS — G629 Polyneuropathy, unspecified: Secondary | ICD-10-CM | POA: Diagnosis not present

## 2021-08-05 DIAGNOSIS — G309 Alzheimer's disease, unspecified: Secondary | ICD-10-CM | POA: Diagnosis not present

## 2021-08-05 DIAGNOSIS — R296 Repeated falls: Secondary | ICD-10-CM | POA: Diagnosis not present

## 2021-08-05 DIAGNOSIS — E1165 Type 2 diabetes mellitus with hyperglycemia: Secondary | ICD-10-CM | POA: Diagnosis not present

## 2021-08-08 DIAGNOSIS — G309 Alzheimer's disease, unspecified: Secondary | ICD-10-CM | POA: Diagnosis not present

## 2021-08-08 DIAGNOSIS — E1165 Type 2 diabetes mellitus with hyperglycemia: Secondary | ICD-10-CM | POA: Diagnosis not present

## 2021-08-08 DIAGNOSIS — R296 Repeated falls: Secondary | ICD-10-CM | POA: Diagnosis not present

## 2021-08-09 DIAGNOSIS — R296 Repeated falls: Secondary | ICD-10-CM | POA: Diagnosis not present

## 2021-08-09 DIAGNOSIS — G309 Alzheimer's disease, unspecified: Secondary | ICD-10-CM | POA: Diagnosis not present

## 2021-08-09 DIAGNOSIS — E1165 Type 2 diabetes mellitus with hyperglycemia: Secondary | ICD-10-CM | POA: Diagnosis not present

## 2021-08-10 DIAGNOSIS — R296 Repeated falls: Secondary | ICD-10-CM | POA: Diagnosis not present

## 2021-08-10 DIAGNOSIS — E1165 Type 2 diabetes mellitus with hyperglycemia: Secondary | ICD-10-CM | POA: Diagnosis not present

## 2021-08-10 DIAGNOSIS — G309 Alzheimer's disease, unspecified: Secondary | ICD-10-CM | POA: Diagnosis not present

## 2021-08-11 DIAGNOSIS — R296 Repeated falls: Secondary | ICD-10-CM | POA: Diagnosis not present

## 2021-08-11 DIAGNOSIS — G309 Alzheimer's disease, unspecified: Secondary | ICD-10-CM | POA: Diagnosis not present

## 2021-08-11 DIAGNOSIS — E1165 Type 2 diabetes mellitus with hyperglycemia: Secondary | ICD-10-CM | POA: Diagnosis not present

## 2021-08-12 DIAGNOSIS — G309 Alzheimer's disease, unspecified: Secondary | ICD-10-CM | POA: Diagnosis not present

## 2021-08-12 DIAGNOSIS — R296 Repeated falls: Secondary | ICD-10-CM | POA: Diagnosis not present

## 2021-08-12 DIAGNOSIS — E1165 Type 2 diabetes mellitus with hyperglycemia: Secondary | ICD-10-CM | POA: Diagnosis not present

## 2021-08-12 DIAGNOSIS — G629 Polyneuropathy, unspecified: Secondary | ICD-10-CM | POA: Diagnosis not present

## 2021-08-12 DIAGNOSIS — Z79899 Other long term (current) drug therapy: Secondary | ICD-10-CM | POA: Diagnosis not present

## 2021-08-15 DIAGNOSIS — E1165 Type 2 diabetes mellitus with hyperglycemia: Secondary | ICD-10-CM | POA: Diagnosis not present

## 2021-08-15 DIAGNOSIS — Z79899 Other long term (current) drug therapy: Secondary | ICD-10-CM | POA: Diagnosis not present

## 2021-08-15 DIAGNOSIS — G309 Alzheimer's disease, unspecified: Secondary | ICD-10-CM | POA: Diagnosis not present

## 2021-08-15 DIAGNOSIS — E119 Type 2 diabetes mellitus without complications: Secondary | ICD-10-CM | POA: Diagnosis not present

## 2021-08-15 DIAGNOSIS — R296 Repeated falls: Secondary | ICD-10-CM | POA: Diagnosis not present

## 2021-08-16 DIAGNOSIS — E1165 Type 2 diabetes mellitus with hyperglycemia: Secondary | ICD-10-CM | POA: Diagnosis not present

## 2021-08-16 DIAGNOSIS — G309 Alzheimer's disease, unspecified: Secondary | ICD-10-CM | POA: Diagnosis not present

## 2021-08-16 DIAGNOSIS — R296 Repeated falls: Secondary | ICD-10-CM | POA: Diagnosis not present

## 2021-08-16 DIAGNOSIS — Z794 Long term (current) use of insulin: Secondary | ICD-10-CM | POA: Diagnosis not present

## 2021-08-16 DIAGNOSIS — Z79899 Other long term (current) drug therapy: Secondary | ICD-10-CM | POA: Diagnosis not present

## 2021-08-17 ENCOUNTER — Other Ambulatory Visit: Payer: Self-pay

## 2021-08-17 ENCOUNTER — Encounter (HOSPITAL_COMMUNITY): Payer: Self-pay | Admitting: Emergency Medicine

## 2021-08-17 ENCOUNTER — Emergency Department (HOSPITAL_COMMUNITY)
Admission: EM | Admit: 2021-08-17 | Discharge: 2021-08-17 | Disposition: A | Discharge status: Home / Self Care | Payer: Medicare Other | Attending: Emergency Medicine | Admitting: Emergency Medicine

## 2021-08-17 DIAGNOSIS — Z87891 Personal history of nicotine dependence: Secondary | ICD-10-CM | POA: Insufficient documentation

## 2021-08-17 DIAGNOSIS — R451 Restlessness and agitation: Secondary | ICD-10-CM | POA: Diagnosis present

## 2021-08-17 DIAGNOSIS — Z79899 Other long term (current) drug therapy: Secondary | ICD-10-CM | POA: Diagnosis not present

## 2021-08-17 DIAGNOSIS — G309 Alzheimer's disease, unspecified: Secondary | ICD-10-CM | POA: Diagnosis not present

## 2021-08-17 DIAGNOSIS — Z7984 Long term (current) use of oral hypoglycemic drugs: Secondary | ICD-10-CM | POA: Diagnosis not present

## 2021-08-17 DIAGNOSIS — Z794 Long term (current) use of insulin: Secondary | ICD-10-CM | POA: Diagnosis not present

## 2021-08-17 DIAGNOSIS — R296 Repeated falls: Secondary | ICD-10-CM | POA: Diagnosis not present

## 2021-08-17 DIAGNOSIS — R404 Transient alteration of awareness: Secondary | ICD-10-CM | POA: Diagnosis not present

## 2021-08-17 DIAGNOSIS — I10 Essential (primary) hypertension: Secondary | ICD-10-CM | POA: Diagnosis not present

## 2021-08-17 DIAGNOSIS — F0391 Unspecified dementia with behavioral disturbance: Secondary | ICD-10-CM | POA: Insufficient documentation

## 2021-08-17 DIAGNOSIS — E119 Type 2 diabetes mellitus without complications: Secondary | ICD-10-CM | POA: Diagnosis not present

## 2021-08-17 DIAGNOSIS — E1165 Type 2 diabetes mellitus with hyperglycemia: Secondary | ICD-10-CM | POA: Diagnosis not present

## 2021-08-17 DIAGNOSIS — F0390 Unspecified dementia without behavioral disturbance: Secondary | ICD-10-CM | POA: Diagnosis not present

## 2021-08-17 DIAGNOSIS — F03911 Unspecified dementia, unspecified severity, with agitation: Secondary | ICD-10-CM

## 2021-08-17 HISTORY — DX: Unspecified dementia with behavioral disturbance: F03.91

## 2021-08-17 HISTORY — DX: Unspecified dementia, unspecified severity, with other behavioral disturbance: F03.918

## 2021-08-17 HISTORY — DX: Schizophrenia, unspecified: F20.9

## 2021-08-17 NOTE — Discharge Instructions (Addendum)
Patient may return to his care facility. Continue with current medication regimen.
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
## 2021-08-17 NOTE — ED Notes (Signed)
Niece taking pt back to facility.[facility] called and rec'd report. Pt ambulatory.
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
## 2021-08-18 DIAGNOSIS — R296 Repeated falls: Secondary | ICD-10-CM | POA: Diagnosis not present

## 2021-08-18 DIAGNOSIS — G309 Alzheimer's disease, unspecified: Secondary | ICD-10-CM | POA: Diagnosis not present

## 2021-08-18 DIAGNOSIS — E1165 Type 2 diabetes mellitus with hyperglycemia: Secondary | ICD-10-CM | POA: Diagnosis not present

## 2021-08-19 DIAGNOSIS — R296 Repeated falls: Secondary | ICD-10-CM | POA: Diagnosis not present

## 2021-08-19 DIAGNOSIS — E1165 Type 2 diabetes mellitus with hyperglycemia: Secondary | ICD-10-CM | POA: Diagnosis not present

## 2021-08-19 DIAGNOSIS — G309 Alzheimer's disease, unspecified: Secondary | ICD-10-CM | POA: Diagnosis not present

## 2021-08-22 DIAGNOSIS — G2401 Drug induced subacute dyskinesia: Secondary | ICD-10-CM | POA: Diagnosis not present

## 2021-08-22 DIAGNOSIS — F321 Major depressive disorder, single episode, moderate: Secondary | ICD-10-CM | POA: Diagnosis not present

## 2021-08-22 DIAGNOSIS — R52 Pain, unspecified: Secondary | ICD-10-CM | POA: Diagnosis not present

## 2021-08-22 DIAGNOSIS — G300 Alzheimer's disease with early onset: Secondary | ICD-10-CM | POA: Diagnosis not present

## 2021-08-22 DIAGNOSIS — F29 Unspecified psychosis not due to a substance or known physiological condition: Secondary | ICD-10-CM | POA: Diagnosis not present

## 2021-08-22 DIAGNOSIS — Z79899 Other long term (current) drug therapy: Secondary | ICD-10-CM | POA: Diagnosis not present

## 2021-08-22 DIAGNOSIS — G309 Alzheimer's disease, unspecified: Secondary | ICD-10-CM | POA: Diagnosis not present

## 2021-08-22 DIAGNOSIS — E785 Hyperlipidemia, unspecified: Secondary | ICD-10-CM | POA: Diagnosis not present

## 2021-08-22 DIAGNOSIS — R296 Repeated falls: Secondary | ICD-10-CM | POA: Diagnosis not present

## 2021-08-22 DIAGNOSIS — G4720 Circadian rhythm sleep disorder, unspecified type: Secondary | ICD-10-CM | POA: Diagnosis not present

## 2021-08-22 DIAGNOSIS — E1165 Type 2 diabetes mellitus with hyperglycemia: Secondary | ICD-10-CM | POA: Diagnosis not present

## 2021-08-22 DIAGNOSIS — E559 Vitamin D deficiency, unspecified: Secondary | ICD-10-CM | POA: Diagnosis not present

## 2021-08-22 DIAGNOSIS — F411 Generalized anxiety disorder: Secondary | ICD-10-CM | POA: Diagnosis not present

## 2021-08-23 DIAGNOSIS — I10 Essential (primary) hypertension: Secondary | ICD-10-CM | POA: Diagnosis not present

## 2021-08-23 DIAGNOSIS — G309 Alzheimer's disease, unspecified: Secondary | ICD-10-CM | POA: Diagnosis not present

## 2021-08-23 DIAGNOSIS — E119 Type 2 diabetes mellitus without complications: Secondary | ICD-10-CM | POA: Diagnosis not present

## 2021-08-23 DIAGNOSIS — G8929 Other chronic pain: Secondary | ICD-10-CM | POA: Diagnosis not present

## 2021-08-23 DIAGNOSIS — K219 Gastro-esophageal reflux disease without esophagitis: Secondary | ICD-10-CM | POA: Diagnosis not present

## 2021-08-23 DIAGNOSIS — R296 Repeated falls: Secondary | ICD-10-CM | POA: Diagnosis not present

## 2021-08-23 DIAGNOSIS — E1165 Type 2 diabetes mellitus with hyperglycemia: Secondary | ICD-10-CM | POA: Diagnosis not present

## 2021-08-24 DIAGNOSIS — E1165 Type 2 diabetes mellitus with hyperglycemia: Secondary | ICD-10-CM | POA: Diagnosis not present

## 2021-08-24 DIAGNOSIS — G309 Alzheimer's disease, unspecified: Secondary | ICD-10-CM | POA: Diagnosis not present

## 2021-08-24 DIAGNOSIS — R296 Repeated falls: Secondary | ICD-10-CM | POA: Diagnosis not present

## 2021-08-25 DIAGNOSIS — R296 Repeated falls: Secondary | ICD-10-CM | POA: Diagnosis not present

## 2021-08-25 DIAGNOSIS — G309 Alzheimer's disease, unspecified: Secondary | ICD-10-CM | POA: Diagnosis not present

## 2021-08-25 DIAGNOSIS — E1165 Type 2 diabetes mellitus with hyperglycemia: Secondary | ICD-10-CM | POA: Diagnosis not present

## 2021-08-26 DIAGNOSIS — R296 Repeated falls: Secondary | ICD-10-CM | POA: Diagnosis not present

## 2021-08-26 DIAGNOSIS — E1165 Type 2 diabetes mellitus with hyperglycemia: Secondary | ICD-10-CM | POA: Diagnosis not present

## 2021-08-26 DIAGNOSIS — G309 Alzheimer's disease, unspecified: Secondary | ICD-10-CM | POA: Diagnosis not present

## 2021-09-05 DIAGNOSIS — G300 Alzheimer's disease with early onset: Secondary | ICD-10-CM | POA: Diagnosis not present

## 2021-09-05 DIAGNOSIS — G2401 Drug induced subacute dyskinesia: Secondary | ICD-10-CM | POA: Diagnosis not present

## 2021-09-05 DIAGNOSIS — F29 Unspecified psychosis not due to a substance or known physiological condition: Secondary | ICD-10-CM | POA: Diagnosis not present

## 2021-09-05 DIAGNOSIS — F3112 Bipolar disorder, current episode manic without psychotic features, moderate: Secondary | ICD-10-CM | POA: Diagnosis not present

## 2021-09-05 DIAGNOSIS — F411 Generalized anxiety disorder: Secondary | ICD-10-CM | POA: Diagnosis not present

## 2021-09-05 DIAGNOSIS — G4720 Circadian rhythm sleep disorder, unspecified type: Secondary | ICD-10-CM | POA: Diagnosis not present

## 2021-09-05 DIAGNOSIS — F321 Major depressive disorder, single episode, moderate: Secondary | ICD-10-CM | POA: Diagnosis not present

## 2021-09-14 DIAGNOSIS — F411 Generalized anxiety disorder: Secondary | ICD-10-CM | POA: Diagnosis not present

## 2021-09-14 DIAGNOSIS — G4720 Circadian rhythm sleep disorder, unspecified type: Secondary | ICD-10-CM | POA: Diagnosis not present

## 2021-09-14 DIAGNOSIS — R4182 Altered mental status, unspecified: Secondary | ICD-10-CM | POA: Diagnosis not present

## 2021-09-14 DIAGNOSIS — Z885 Allergy status to narcotic agent status: Secondary | ICD-10-CM | POA: Diagnosis not present

## 2021-09-14 DIAGNOSIS — F3112 Bipolar disorder, current episode manic without psychotic features, moderate: Secondary | ICD-10-CM | POA: Diagnosis not present

## 2021-09-14 DIAGNOSIS — G300 Alzheimer's disease with early onset: Secondary | ICD-10-CM | POA: Diagnosis not present

## 2021-09-14 DIAGNOSIS — Z880 Allergy status to penicillin: Secondary | ICD-10-CM | POA: Diagnosis not present

## 2021-09-14 DIAGNOSIS — G3184 Mild cognitive impairment, so stated: Secondary | ICD-10-CM | POA: Diagnosis not present

## 2021-09-14 DIAGNOSIS — G2401 Drug induced subacute dyskinesia: Secondary | ICD-10-CM | POA: Diagnosis not present

## 2021-09-14 DIAGNOSIS — F29 Unspecified psychosis not due to a substance or known physiological condition: Secondary | ICD-10-CM | POA: Diagnosis not present

## 2021-09-14 DIAGNOSIS — F03918 Unspecified dementia, unspecified severity, with other behavioral disturbance: Secondary | ICD-10-CM | POA: Diagnosis not present

## 2021-09-14 DIAGNOSIS — R7989 Other specified abnormal findings of blood chemistry: Secondary | ICD-10-CM | POA: Diagnosis not present

## 2021-09-14 DIAGNOSIS — F321 Major depressive disorder, single episode, moderate: Secondary | ICD-10-CM | POA: Diagnosis not present

## 2021-09-15 DIAGNOSIS — F03918 Unspecified dementia, unspecified severity, with other behavioral disturbance: Secondary | ICD-10-CM | POA: Diagnosis not present

## 2021-09-15 DIAGNOSIS — R4182 Altered mental status, unspecified: Secondary | ICD-10-CM | POA: Diagnosis not present

## 2021-09-15 DIAGNOSIS — Z885 Allergy status to narcotic agent status: Secondary | ICD-10-CM | POA: Diagnosis not present

## 2021-09-15 DIAGNOSIS — Z880 Allergy status to penicillin: Secondary | ICD-10-CM | POA: Diagnosis not present

## 2021-09-16 DIAGNOSIS — F0280 Dementia in other diseases classified elsewhere without behavioral disturbance: Secondary | ICD-10-CM | POA: Diagnosis not present

## 2021-09-16 DIAGNOSIS — M549 Dorsalgia, unspecified: Secondary | ICD-10-CM | POA: Diagnosis not present

## 2021-09-16 DIAGNOSIS — K120 Recurrent oral aphthae: Secondary | ICD-10-CM | POA: Diagnosis not present

## 2021-09-16 DIAGNOSIS — E119 Type 2 diabetes mellitus without complications: Secondary | ICD-10-CM | POA: Diagnosis not present

## 2021-09-19 IMAGING — CT CT HEAD W/O CM
3 series · 16 of 47 positions shown, 19 images · non-contrast
Comparison: CT 06/04/2017, MRI 02/12/2019

CLINICAL DATA: Mental status change

EXAM:
CT HEAD WITHOUT CONTRAST
TECHNIQUE: Contiguous axial images were obtained from the base of the skull
through the vertex without intravenous contrast.

[Series 2: head w o · axial · 0.41mm/px · z∈[+1484,+1614]mm · 10 of 32 slices shown, 13 images]
[im 3/32  brain]
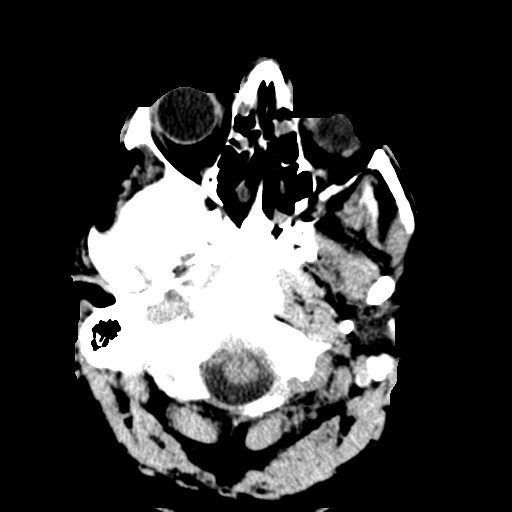
[im 3/32  bone]
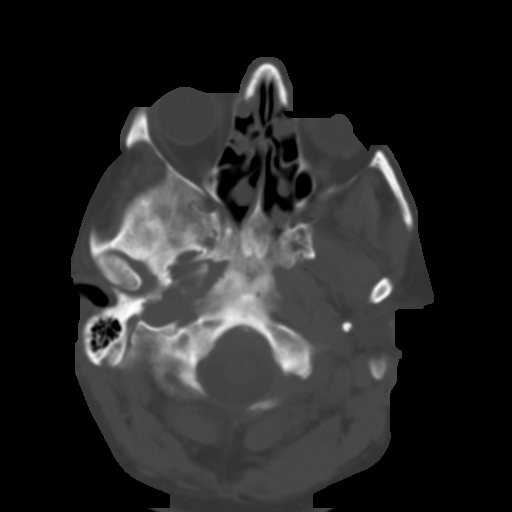
[im 6/32  brain]
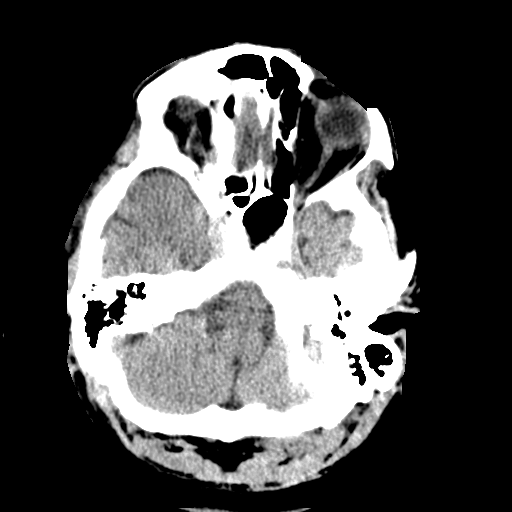
[im 9/32  brain]
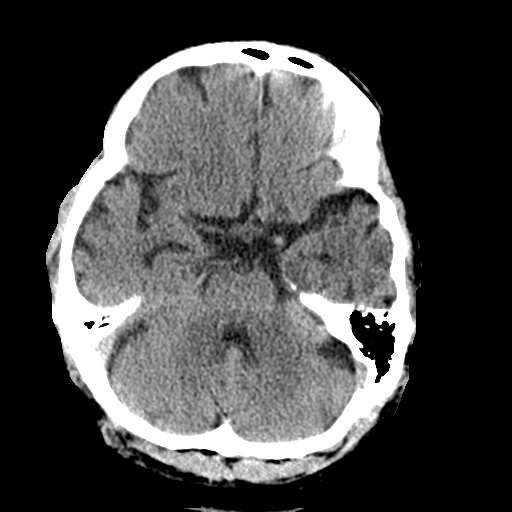
[im 11/32  brain]
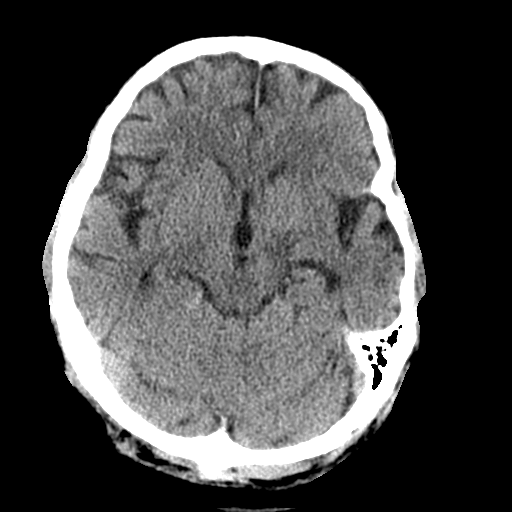
[im 14/32  brain]
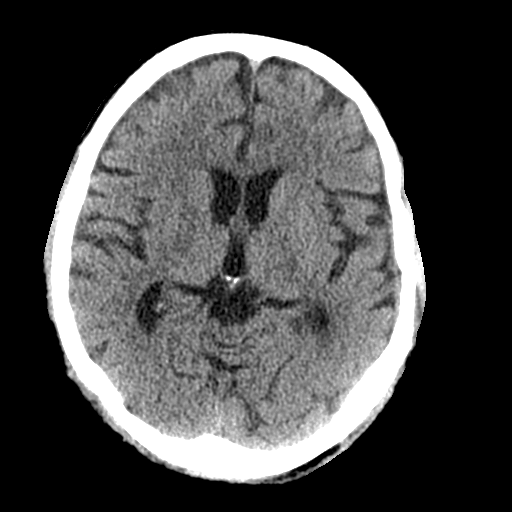
[im 14/32  bone]
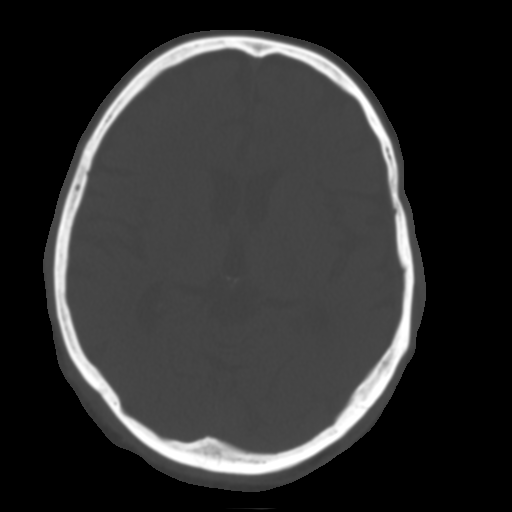
[im 18/32  brain]
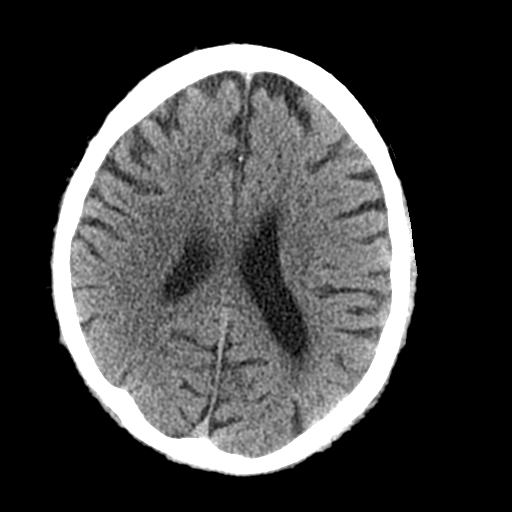
[im 21/32  brain]
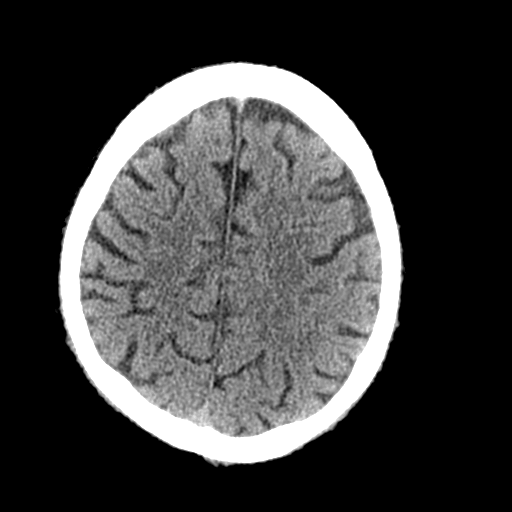
[im 24/32  brain]
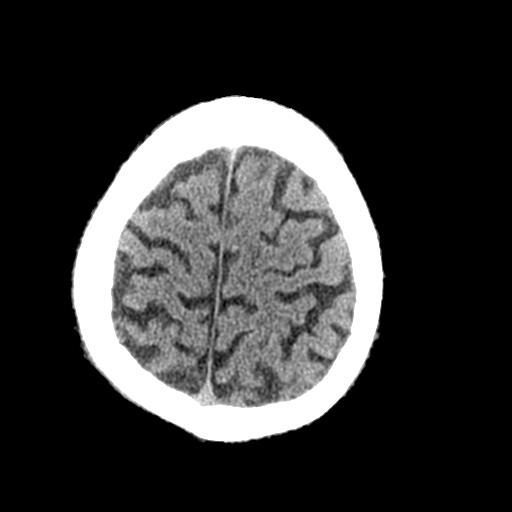
[im 26/32  brain]
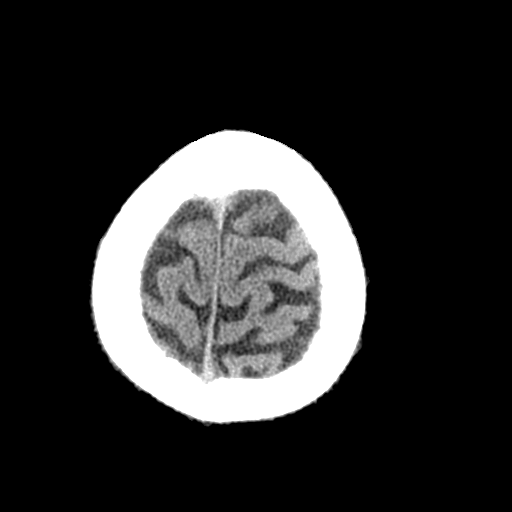
[im 26/32  bone]
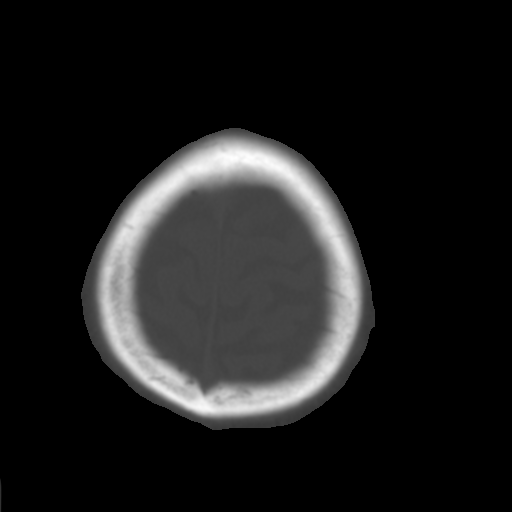
[im 29/32  brain]
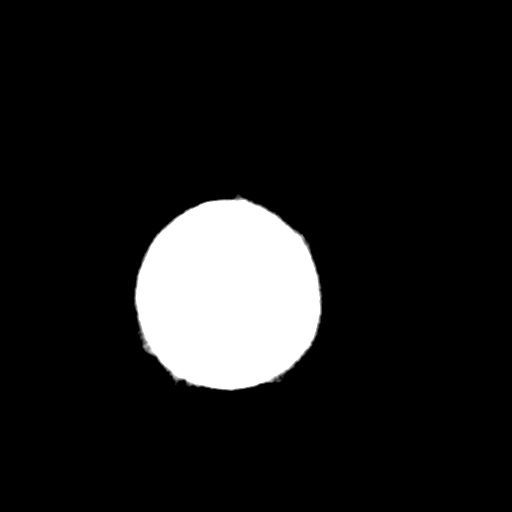

[Series 4: coronal soft · coronal · 0.36mm/px · 3 of 67 slices shown]
[im 23/67  brain]
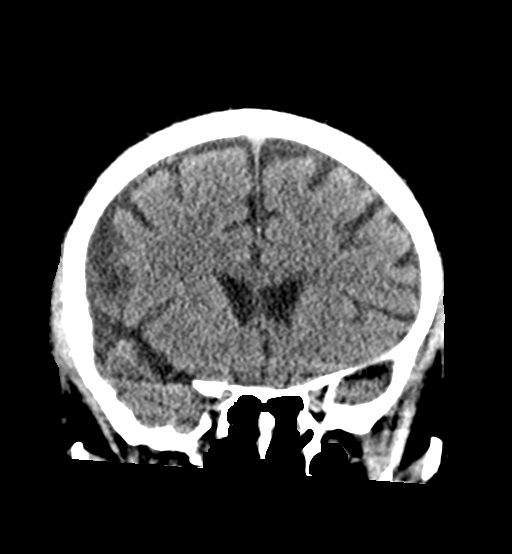
[im 30/67  brain]
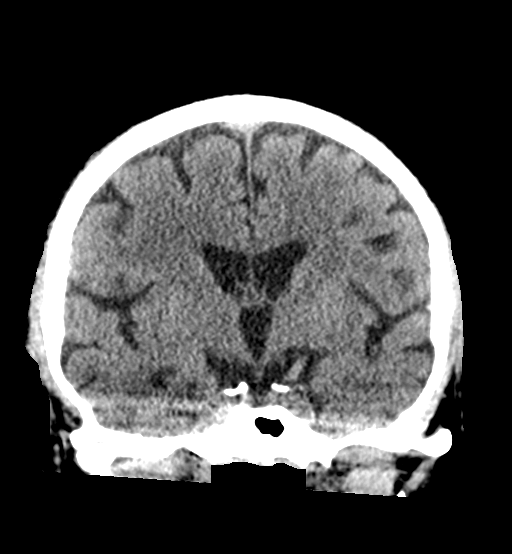
[im 37/67  brain]
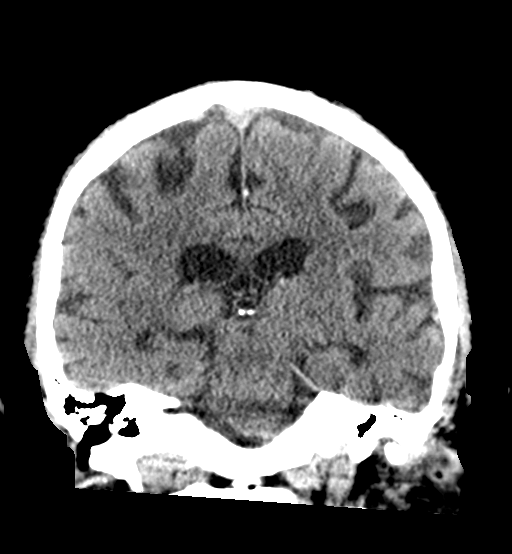

[Series 5: sagittal soft · sagittal · 0.36mm/px · 3 of 61 slices shown]
[im 21/61  brain]
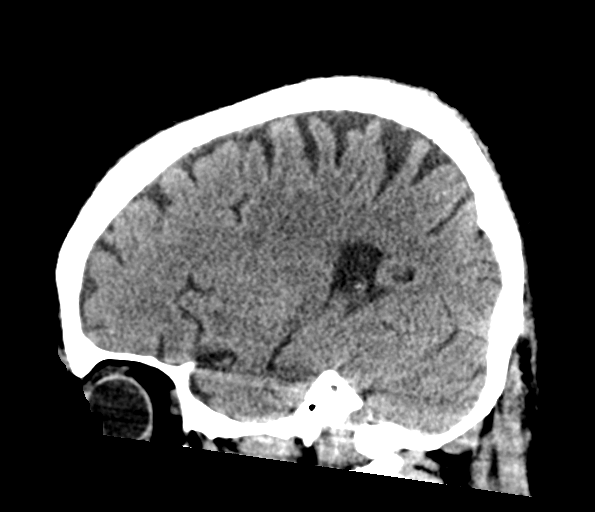
[im 31/61  brain]
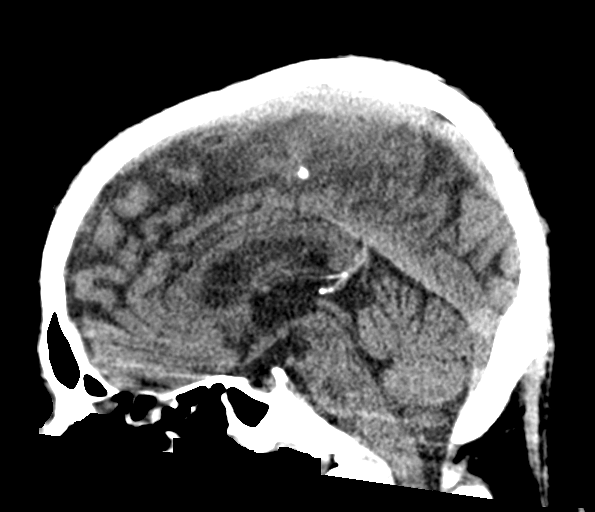
[im 41/61  brain]
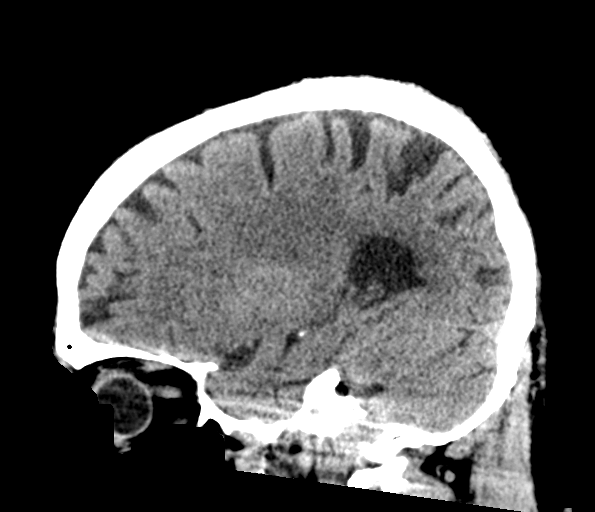

[16 of 47 positions shown; findings below may reference images not displayed]

FINDINGS: Brain: No acute territorial infarction, hemorrhage, or intracranial
mass is visualized. The ventricles are nonenlarged. There is mild
atrophy

Vascular: No hyperdense vessels.  No unexpected calcification

Skull: Normal. Negative for fracture or focal lesion. Incomplete
fusion posterior arch of C1

Sinuses/Orbits: Mucosal thickening in the ethmoid sinuses

Other: None
IMPRESSION: 1. No CT evidence for acute intracranial abnormality.
2. Mild atrophy.

## 2021-09-22 DIAGNOSIS — Z23 Encounter for immunization: Secondary | ICD-10-CM | POA: Diagnosis not present

## 2021-09-26 DIAGNOSIS — F321 Major depressive disorder, single episode, moderate: Secondary | ICD-10-CM | POA: Diagnosis not present

## 2021-09-26 DIAGNOSIS — F29 Unspecified psychosis not due to a substance or known physiological condition: Secondary | ICD-10-CM | POA: Diagnosis not present

## 2021-09-26 DIAGNOSIS — G300 Alzheimer's disease with early onset: Secondary | ICD-10-CM | POA: Diagnosis not present

## 2021-09-26 DIAGNOSIS — F411 Generalized anxiety disorder: Secondary | ICD-10-CM | POA: Diagnosis not present

## 2021-09-26 DIAGNOSIS — G2401 Drug induced subacute dyskinesia: Secondary | ICD-10-CM | POA: Diagnosis not present

## 2021-09-26 DIAGNOSIS — F3112 Bipolar disorder, current episode manic without psychotic features, moderate: Secondary | ICD-10-CM | POA: Diagnosis not present

## 2021-09-26 DIAGNOSIS — G4720 Circadian rhythm sleep disorder, unspecified type: Secondary | ICD-10-CM | POA: Diagnosis not present

## 2021-09-27 DIAGNOSIS — K120 Recurrent oral aphthae: Secondary | ICD-10-CM | POA: Diagnosis not present

## 2021-09-27 DIAGNOSIS — G4700 Insomnia, unspecified: Secondary | ICD-10-CM | POA: Diagnosis not present

## 2021-09-27 DIAGNOSIS — Z79899 Other long term (current) drug therapy: Secondary | ICD-10-CM | POA: Diagnosis not present

## 2021-09-28 DIAGNOSIS — E559 Vitamin D deficiency, unspecified: Secondary | ICD-10-CM | POA: Diagnosis not present

## 2021-09-28 DIAGNOSIS — D649 Anemia, unspecified: Secondary | ICD-10-CM | POA: Diagnosis not present

## 2021-09-28 DIAGNOSIS — E039 Hypothyroidism, unspecified: Secondary | ICD-10-CM | POA: Diagnosis not present

## 2021-09-28 DIAGNOSIS — Z79899 Other long term (current) drug therapy: Secondary | ICD-10-CM | POA: Diagnosis not present

## 2021-10-05 DIAGNOSIS — M25641 Stiffness of right hand, not elsewhere classified: Secondary | ICD-10-CM | POA: Diagnosis not present

## 2021-10-05 DIAGNOSIS — M25631 Stiffness of right wrist, not elsewhere classified: Secondary | ICD-10-CM | POA: Diagnosis not present

## 2021-10-10 DIAGNOSIS — Z79899 Other long term (current) drug therapy: Secondary | ICD-10-CM | POA: Diagnosis not present

## 2021-10-10 DIAGNOSIS — G2401 Drug induced subacute dyskinesia: Secondary | ICD-10-CM | POA: Diagnosis not present

## 2021-10-10 DIAGNOSIS — F29 Unspecified psychosis not due to a substance or known physiological condition: Secondary | ICD-10-CM | POA: Diagnosis not present

## 2021-10-10 DIAGNOSIS — F411 Generalized anxiety disorder: Secondary | ICD-10-CM | POA: Diagnosis not present

## 2021-10-10 DIAGNOSIS — F321 Major depressive disorder, single episode, moderate: Secondary | ICD-10-CM | POA: Diagnosis not present

## 2021-10-10 DIAGNOSIS — F3112 Bipolar disorder, current episode manic without psychotic features, moderate: Secondary | ICD-10-CM | POA: Diagnosis not present

## 2021-10-10 DIAGNOSIS — M24541 Contracture, right hand: Secondary | ICD-10-CM | POA: Diagnosis not present

## 2021-10-10 DIAGNOSIS — G4720 Circadian rhythm sleep disorder, unspecified type: Secondary | ICD-10-CM | POA: Diagnosis not present

## 2021-10-10 DIAGNOSIS — G300 Alzheimer's disease with early onset: Secondary | ICD-10-CM | POA: Diagnosis not present

## 2021-10-12 DIAGNOSIS — K219 Gastro-esophageal reflux disease without esophagitis: Secondary | ICD-10-CM | POA: Diagnosis not present

## 2021-10-12 DIAGNOSIS — I10 Essential (primary) hypertension: Secondary | ICD-10-CM | POA: Diagnosis not present

## 2021-10-12 DIAGNOSIS — E119 Type 2 diabetes mellitus without complications: Secondary | ICD-10-CM | POA: Diagnosis not present

## 2021-10-12 DIAGNOSIS — G309 Alzheimer's disease, unspecified: Secondary | ICD-10-CM | POA: Diagnosis not present

## 2021-10-13 DIAGNOSIS — Z79899 Other long term (current) drug therapy: Secondary | ICD-10-CM | POA: Diagnosis not present

## 2021-10-13 DIAGNOSIS — G629 Polyneuropathy, unspecified: Secondary | ICD-10-CM | POA: Diagnosis not present

## 2021-10-24 DIAGNOSIS — M24541 Contracture, right hand: Secondary | ICD-10-CM | POA: Diagnosis not present

## 2021-10-25 DIAGNOSIS — M24541 Contracture, right hand: Secondary | ICD-10-CM | POA: Diagnosis not present

## 2021-10-26 DIAGNOSIS — M24541 Contracture, right hand: Secondary | ICD-10-CM | POA: Diagnosis not present

## 2021-10-27 DIAGNOSIS — M24541 Contracture, right hand: Secondary | ICD-10-CM | POA: Diagnosis not present

## 2021-10-27 DIAGNOSIS — G300 Alzheimer's disease with early onset: Secondary | ICD-10-CM | POA: Diagnosis not present

## 2021-10-27 DIAGNOSIS — R1311 Dysphagia, oral phase: Secondary | ICD-10-CM | POA: Diagnosis not present

## 2021-10-28 DIAGNOSIS — M24541 Contracture, right hand: Secondary | ICD-10-CM | POA: Diagnosis not present

## 2021-10-28 DIAGNOSIS — G300 Alzheimer's disease with early onset: Secondary | ICD-10-CM | POA: Diagnosis not present

## 2021-10-28 DIAGNOSIS — R1311 Dysphagia, oral phase: Secondary | ICD-10-CM | POA: Diagnosis not present

## 2021-10-31 DIAGNOSIS — G300 Alzheimer's disease with early onset: Secondary | ICD-10-CM | POA: Diagnosis not present

## 2021-10-31 DIAGNOSIS — M24541 Contracture, right hand: Secondary | ICD-10-CM | POA: Diagnosis not present

## 2021-10-31 DIAGNOSIS — R1311 Dysphagia, oral phase: Secondary | ICD-10-CM | POA: Diagnosis not present

## 2021-11-01 DIAGNOSIS — G300 Alzheimer's disease with early onset: Secondary | ICD-10-CM | POA: Diagnosis not present

## 2021-11-01 DIAGNOSIS — M24541 Contracture, right hand: Secondary | ICD-10-CM | POA: Diagnosis not present

## 2021-11-01 DIAGNOSIS — R1311 Dysphagia, oral phase: Secondary | ICD-10-CM | POA: Diagnosis not present

## 2021-11-02 DIAGNOSIS — G300 Alzheimer's disease with early onset: Secondary | ICD-10-CM | POA: Diagnosis not present

## 2021-11-02 DIAGNOSIS — E559 Vitamin D deficiency, unspecified: Secondary | ICD-10-CM | POA: Diagnosis not present

## 2021-11-02 DIAGNOSIS — R1311 Dysphagia, oral phase: Secondary | ICD-10-CM | POA: Diagnosis not present

## 2021-11-02 DIAGNOSIS — Z79899 Other long term (current) drug therapy: Secondary | ICD-10-CM | POA: Diagnosis not present

## 2021-11-02 DIAGNOSIS — E119 Type 2 diabetes mellitus without complications: Secondary | ICD-10-CM | POA: Diagnosis not present

## 2021-11-02 DIAGNOSIS — M24541 Contracture, right hand: Secondary | ICD-10-CM | POA: Diagnosis not present

## 2021-11-03 DIAGNOSIS — M24541 Contracture, right hand: Secondary | ICD-10-CM | POA: Diagnosis not present

## 2021-11-03 DIAGNOSIS — G300 Alzheimer's disease with early onset: Secondary | ICD-10-CM | POA: Diagnosis not present

## 2021-11-03 DIAGNOSIS — R1311 Dysphagia, oral phase: Secondary | ICD-10-CM | POA: Diagnosis not present

## 2021-11-04 DIAGNOSIS — R1311 Dysphagia, oral phase: Secondary | ICD-10-CM | POA: Diagnosis not present

## 2021-11-04 DIAGNOSIS — M24541 Contracture, right hand: Secondary | ICD-10-CM | POA: Diagnosis not present

## 2021-11-04 DIAGNOSIS — G300 Alzheimer's disease with early onset: Secondary | ICD-10-CM | POA: Diagnosis not present

## 2021-11-07 DIAGNOSIS — M24541 Contracture, right hand: Secondary | ICD-10-CM | POA: Diagnosis not present

## 2021-11-07 DIAGNOSIS — R1311 Dysphagia, oral phase: Secondary | ICD-10-CM | POA: Diagnosis not present

## 2021-11-07 DIAGNOSIS — G300 Alzheimer's disease with early onset: Secondary | ICD-10-CM | POA: Diagnosis not present

## 2021-11-08 DIAGNOSIS — M24541 Contracture, right hand: Secondary | ICD-10-CM | POA: Diagnosis not present

## 2021-11-08 DIAGNOSIS — G629 Polyneuropathy, unspecified: Secondary | ICD-10-CM | POA: Diagnosis not present

## 2021-11-08 DIAGNOSIS — R1311 Dysphagia, oral phase: Secondary | ICD-10-CM | POA: Diagnosis not present

## 2021-11-08 DIAGNOSIS — Z79899 Other long term (current) drug therapy: Secondary | ICD-10-CM | POA: Diagnosis not present

## 2021-11-08 DIAGNOSIS — G300 Alzheimer's disease with early onset: Secondary | ICD-10-CM | POA: Diagnosis not present

## 2021-11-09 DIAGNOSIS — R1311 Dysphagia, oral phase: Secondary | ICD-10-CM | POA: Diagnosis not present

## 2021-11-09 DIAGNOSIS — G300 Alzheimer's disease with early onset: Secondary | ICD-10-CM | POA: Diagnosis not present

## 2021-11-09 DIAGNOSIS — M24541 Contracture, right hand: Secondary | ICD-10-CM | POA: Diagnosis not present

## 2021-11-10 DIAGNOSIS — G300 Alzheimer's disease with early onset: Secondary | ICD-10-CM | POA: Diagnosis not present

## 2021-11-10 DIAGNOSIS — E119 Type 2 diabetes mellitus without complications: Secondary | ICD-10-CM | POA: Diagnosis not present

## 2021-11-10 DIAGNOSIS — E785 Hyperlipidemia, unspecified: Secondary | ICD-10-CM | POA: Diagnosis not present

## 2021-11-10 DIAGNOSIS — M24541 Contracture, right hand: Secondary | ICD-10-CM | POA: Diagnosis not present

## 2021-11-10 DIAGNOSIS — R1311 Dysphagia, oral phase: Secondary | ICD-10-CM | POA: Diagnosis not present

## 2021-11-10 DIAGNOSIS — E559 Vitamin D deficiency, unspecified: Secondary | ICD-10-CM | POA: Diagnosis not present

## 2021-11-10 DIAGNOSIS — Z79899 Other long term (current) drug therapy: Secondary | ICD-10-CM | POA: Diagnosis not present

## 2021-11-11 DIAGNOSIS — M24541 Contracture, right hand: Secondary | ICD-10-CM | POA: Diagnosis not present

## 2021-11-11 DIAGNOSIS — R1311 Dysphagia, oral phase: Secondary | ICD-10-CM | POA: Diagnosis not present

## 2021-11-11 DIAGNOSIS — G300 Alzheimer's disease with early onset: Secondary | ICD-10-CM | POA: Diagnosis not present

## 2021-11-14 DIAGNOSIS — F29 Unspecified psychosis not due to a substance or known physiological condition: Secondary | ICD-10-CM | POA: Diagnosis not present

## 2021-11-14 DIAGNOSIS — G2401 Drug induced subacute dyskinesia: Secondary | ICD-10-CM | POA: Diagnosis not present

## 2021-11-14 DIAGNOSIS — M24541 Contracture, right hand: Secondary | ICD-10-CM | POA: Diagnosis not present

## 2021-11-14 DIAGNOSIS — R1311 Dysphagia, oral phase: Secondary | ICD-10-CM | POA: Diagnosis not present

## 2021-11-14 DIAGNOSIS — F411 Generalized anxiety disorder: Secondary | ICD-10-CM | POA: Diagnosis not present

## 2021-11-14 DIAGNOSIS — F321 Major depressive disorder, single episode, moderate: Secondary | ICD-10-CM | POA: Diagnosis not present

## 2021-11-14 DIAGNOSIS — G4720 Circadian rhythm sleep disorder, unspecified type: Secondary | ICD-10-CM | POA: Diagnosis not present

## 2021-11-14 DIAGNOSIS — F3112 Bipolar disorder, current episode manic without psychotic features, moderate: Secondary | ICD-10-CM | POA: Diagnosis not present

## 2021-11-14 DIAGNOSIS — G300 Alzheimer's disease with early onset: Secondary | ICD-10-CM | POA: Diagnosis not present

## 2021-11-15 DIAGNOSIS — M24541 Contracture, right hand: Secondary | ICD-10-CM | POA: Diagnosis not present

## 2021-11-15 DIAGNOSIS — G300 Alzheimer's disease with early onset: Secondary | ICD-10-CM | POA: Diagnosis not present

## 2021-11-15 DIAGNOSIS — R1311 Dysphagia, oral phase: Secondary | ICD-10-CM | POA: Diagnosis not present

## 2021-11-16 DIAGNOSIS — M24541 Contracture, right hand: Secondary | ICD-10-CM | POA: Diagnosis not present

## 2021-11-16 DIAGNOSIS — G300 Alzheimer's disease with early onset: Secondary | ICD-10-CM | POA: Diagnosis not present

## 2021-11-16 DIAGNOSIS — R1311 Dysphagia, oral phase: Secondary | ICD-10-CM | POA: Diagnosis not present

## 2021-11-17 DIAGNOSIS — G300 Alzheimer's disease with early onset: Secondary | ICD-10-CM | POA: Diagnosis not present

## 2021-11-17 DIAGNOSIS — R1311 Dysphagia, oral phase: Secondary | ICD-10-CM | POA: Diagnosis not present

## 2021-11-17 DIAGNOSIS — M24541 Contracture, right hand: Secondary | ICD-10-CM | POA: Diagnosis not present

## 2021-11-18 DIAGNOSIS — R1311 Dysphagia, oral phase: Secondary | ICD-10-CM | POA: Diagnosis not present

## 2021-11-18 DIAGNOSIS — M24541 Contracture, right hand: Secondary | ICD-10-CM | POA: Diagnosis not present

## 2021-11-18 DIAGNOSIS — G300 Alzheimer's disease with early onset: Secondary | ICD-10-CM | POA: Diagnosis not present

## 2021-11-21 DIAGNOSIS — F411 Generalized anxiety disorder: Secondary | ICD-10-CM | POA: Diagnosis not present

## 2021-11-21 DIAGNOSIS — G2401 Drug induced subacute dyskinesia: Secondary | ICD-10-CM | POA: Diagnosis not present

## 2021-11-21 DIAGNOSIS — M24541 Contracture, right hand: Secondary | ICD-10-CM | POA: Diagnosis not present

## 2021-11-21 DIAGNOSIS — F321 Major depressive disorder, single episode, moderate: Secondary | ICD-10-CM | POA: Diagnosis not present

## 2021-11-21 DIAGNOSIS — F29 Unspecified psychosis not due to a substance or known physiological condition: Secondary | ICD-10-CM | POA: Diagnosis not present

## 2021-11-21 DIAGNOSIS — F3112 Bipolar disorder, current episode manic without psychotic features, moderate: Secondary | ICD-10-CM | POA: Diagnosis not present

## 2021-11-21 DIAGNOSIS — G4720 Circadian rhythm sleep disorder, unspecified type: Secondary | ICD-10-CM | POA: Diagnosis not present

## 2021-11-21 DIAGNOSIS — G300 Alzheimer's disease with early onset: Secondary | ICD-10-CM | POA: Diagnosis not present

## 2021-11-21 DIAGNOSIS — R1311 Dysphagia, oral phase: Secondary | ICD-10-CM | POA: Diagnosis not present

## 2021-11-22 DIAGNOSIS — M24541 Contracture, right hand: Secondary | ICD-10-CM | POA: Diagnosis not present

## 2021-11-22 DIAGNOSIS — G300 Alzheimer's disease with early onset: Secondary | ICD-10-CM | POA: Diagnosis not present

## 2021-11-22 DIAGNOSIS — R1311 Dysphagia, oral phase: Secondary | ICD-10-CM | POA: Diagnosis not present

## 2021-11-23 DIAGNOSIS — R1311 Dysphagia, oral phase: Secondary | ICD-10-CM | POA: Diagnosis not present

## 2021-11-23 DIAGNOSIS — M24541 Contracture, right hand: Secondary | ICD-10-CM | POA: Diagnosis not present

## 2021-11-23 DIAGNOSIS — G300 Alzheimer's disease with early onset: Secondary | ICD-10-CM | POA: Diagnosis not present

## 2021-11-24 DIAGNOSIS — R1311 Dysphagia, oral phase: Secondary | ICD-10-CM | POA: Diagnosis not present

## 2021-11-24 DIAGNOSIS — M24541 Contracture, right hand: Secondary | ICD-10-CM | POA: Diagnosis not present

## 2021-11-24 DIAGNOSIS — G300 Alzheimer's disease with early onset: Secondary | ICD-10-CM | POA: Diagnosis not present

## 2021-11-24 DIAGNOSIS — R451 Restlessness and agitation: Secondary | ICD-10-CM | POA: Diagnosis not present

## 2021-11-24 DIAGNOSIS — Z79899 Other long term (current) drug therapy: Secondary | ICD-10-CM | POA: Diagnosis not present

## 2021-11-25 DIAGNOSIS — R1311 Dysphagia, oral phase: Secondary | ICD-10-CM | POA: Diagnosis not present

## 2021-11-25 DIAGNOSIS — M24541 Contracture, right hand: Secondary | ICD-10-CM | POA: Diagnosis not present

## 2021-11-25 DIAGNOSIS — G300 Alzheimer's disease with early onset: Secondary | ICD-10-CM | POA: Diagnosis not present

## 2022-01-18 ENCOUNTER — Emergency Department (HOSPITAL_COMMUNITY): Payer: Medicare Other

## 2022-01-18 ENCOUNTER — Encounter (HOSPITAL_COMMUNITY): Payer: Self-pay | Admitting: Emergency Medicine

## 2022-01-18 ENCOUNTER — Emergency Department (HOSPITAL_COMMUNITY)
Admission: EM | Admit: 2022-01-18 | Discharge: 2022-01-18 | Disposition: A | Discharge status: Skilled Nursing Facility | Payer: Medicare Other | Attending: Emergency Medicine | Admitting: Emergency Medicine

## 2022-01-18 DIAGNOSIS — Z20822 Contact with and (suspected) exposure to covid-19: Secondary | ICD-10-CM | POA: Diagnosis not present

## 2022-01-18 DIAGNOSIS — Z794 Long term (current) use of insulin: Secondary | ICD-10-CM | POA: Diagnosis not present

## 2022-01-18 DIAGNOSIS — E1165 Type 2 diabetes mellitus with hyperglycemia: Secondary | ICD-10-CM | POA: Insufficient documentation

## 2022-01-18 DIAGNOSIS — Z7984 Long term (current) use of oral hypoglycemic drugs: Secondary | ICD-10-CM | POA: Diagnosis not present

## 2022-01-18 DIAGNOSIS — F03918 Unspecified dementia, unspecified severity, with other behavioral disturbance: Secondary | ICD-10-CM | POA: Diagnosis not present

## 2022-01-18 DIAGNOSIS — I10 Essential (primary) hypertension: Secondary | ICD-10-CM | POA: Insufficient documentation

## 2022-01-18 DIAGNOSIS — R519 Headache, unspecified: Secondary | ICD-10-CM | POA: Insufficient documentation

## 2022-01-18 DIAGNOSIS — Z79899 Other long term (current) drug therapy: Secondary | ICD-10-CM | POA: Diagnosis not present

## 2022-01-18 DIAGNOSIS — R4689 Other symptoms and signs involving appearance and behavior: Secondary | ICD-10-CM | POA: Diagnosis present

## 2022-01-18 LAB — CBC WITH DIFFERENTIAL/PLATELET
Abs Immature Granulocytes: 0.01 10*3/uL (ref 0.00–0.07)
Basophils Absolute: 0 10*3/uL (ref 0.0–0.1)
Basophils Relative: 0 %
Eosinophils Absolute: 0.3 10*3/uL (ref 0.0–0.5)
Eosinophils Relative: 4 %
HCT: 34.3 % — ABNORMAL LOW (ref 39.0–52.0)
Hemoglobin: 11.6 g/dL — ABNORMAL LOW (ref 13.0–17.0)
Immature Granulocytes: 0 %
Lymphocytes Relative: 34 %
Lymphs Abs: 2.8 10*3/uL (ref 0.7–4.0)
MCH: 32.4 pg (ref 26.0–34.0)
MCHC: 33.8 g/dL (ref 30.0–36.0)
MCV: 95.8 fL (ref 80.0–100.0)
Monocytes Absolute: 0.5 10*3/uL (ref 0.1–1.0)
Monocytes Relative: 6 %
Neutro Abs: 4.5 10*3/uL (ref 1.7–7.7)
Neutrophils Relative %: 56 %
Platelets: 193 10*3/uL (ref 150–400)
RBC: 3.58 MIL/uL — ABNORMAL LOW (ref 4.22–5.81)
RDW: 12.3 % (ref 11.5–15.5)
WBC: 8.2 10*3/uL (ref 4.0–10.5)
nRBC: 0 % (ref 0.0–0.2)

## 2022-01-18 LAB — BASIC METABOLIC PANEL
Anion gap: 10 (ref 5–15)
BUN: 32 mg/dL — ABNORMAL HIGH (ref 8–23)
CO2: 23 mmol/L (ref 22–32)
Calcium: 9 mg/dL (ref 8.9–10.3)
Chloride: 103 mmol/L (ref 98–111)
Creatinine, Ser: 1.34 mg/dL — ABNORMAL HIGH (ref 0.61–1.24)
GFR, Estimated: 58 mL/min — ABNORMAL LOW (ref >60)
Glucose, Bld: 234 mg/dL — ABNORMAL HIGH (ref 70–99)
Potassium: 4 mmol/L (ref 3.5–5.1)
Sodium: 136 mmol/L (ref 135–145)

## 2022-01-18 LAB — RESP PANEL BY RT-PCR (FLU A&B, COVID) ARPGX2
Influenza A by PCR: NEGATIVE
Influenza B by PCR: NEGATIVE
SARS Coronavirus 2 by RT PCR: NEGATIVE

## 2022-01-18 LAB — CBG MONITORING, ED: Glucose-Capillary: 275 mg/dL — ABNORMAL HIGH (ref 70–99)

## 2022-01-18 NOTE — ED Notes (Signed)
Per facility pt does not have PRN ativan anymore for these situations. Facility is requesting a psych eval and PRN medication.

## 2022-01-18 NOTE — Discharge Instructions (Signed)
Lab or imaging reassuring, please continue with all home medications.  Follow-up with PCP as needed.  Come back to the emergency department if you develop chest pain, shortness of breath, severe abdominal pain, uncontrolled nausea, vomiting, diarrhea.
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
## 2022-02-03 ENCOUNTER — Other Ambulatory Visit: Payer: Self-pay

## 2022-02-03 ENCOUNTER — Emergency Department (HOSPITAL_COMMUNITY)
Admission: EM | Admit: 2022-02-03 | Discharge: 2022-02-03 | Disposition: A | Discharge status: Skilled Nursing Facility | Payer: Medicare Other | Attending: Emergency Medicine | Admitting: Emergency Medicine

## 2022-02-03 ENCOUNTER — Encounter (HOSPITAL_COMMUNITY): Payer: Self-pay | Admitting: Emergency Medicine

## 2022-02-03 DIAGNOSIS — Z20822 Contact with and (suspected) exposure to covid-19: Secondary | ICD-10-CM | POA: Insufficient documentation

## 2022-02-03 DIAGNOSIS — Z79899 Other long term (current) drug therapy: Secondary | ICD-10-CM | POA: Diagnosis not present

## 2022-02-03 DIAGNOSIS — Z794 Long term (current) use of insulin: Secondary | ICD-10-CM | POA: Diagnosis not present

## 2022-02-03 DIAGNOSIS — F03918 Unspecified dementia, unspecified severity, with other behavioral disturbance: Secondary | ICD-10-CM | POA: Diagnosis present

## 2022-02-03 LAB — CBC WITH DIFFERENTIAL/PLATELET
Abs Immature Granulocytes: 0.02 10*3/uL (ref 0.00–0.07)
Basophils Absolute: 0 10*3/uL (ref 0.0–0.1)
Basophils Relative: 1 %
Eosinophils Absolute: 0.4 10*3/uL (ref 0.0–0.5)
Eosinophils Relative: 5 %
HCT: 34.2 % — ABNORMAL LOW (ref 39.0–52.0)
Hemoglobin: 11.5 g/dL — ABNORMAL LOW (ref 13.0–17.0)
Immature Granulocytes: 0 %
Lymphocytes Relative: 29 %
Lymphs Abs: 2.3 10*3/uL (ref 0.7–4.0)
MCH: 32 pg (ref 26.0–34.0)
MCHC: 33.6 g/dL (ref 30.0–36.0)
MCV: 95.3 fL (ref 80.0–100.0)
Monocytes Absolute: 0.6 10*3/uL (ref 0.1–1.0)
Monocytes Relative: 8 %
Neutro Abs: 4.6 10*3/uL (ref 1.7–7.7)
Neutrophils Relative %: 57 %
Platelets: 163 10*3/uL (ref 150–400)
RBC: 3.59 MIL/uL — ABNORMAL LOW (ref 4.22–5.81)
RDW: 12.7 % (ref 11.5–15.5)
WBC: 7.9 10*3/uL (ref 4.0–10.5)
nRBC: 0 % (ref 0.0–0.2)

## 2022-02-03 LAB — RAPID URINE DRUG SCREEN, HOSP PERFORMED
Amphetamines: NOT DETECTED
Barbiturates: NOT DETECTED
Benzodiazepines: POSITIVE — AB
Cocaine: NOT DETECTED
Opiates: NOT DETECTED
Tetrahydrocannabinol: NOT DETECTED

## 2022-02-03 LAB — COMPREHENSIVE METABOLIC PANEL
ALT: 12 U/L (ref 0–44)
AST: 17 U/L (ref 15–41)
Albumin: 3.7 g/dL (ref 3.5–5.0)
Alkaline Phosphatase: 32 U/L — ABNORMAL LOW (ref 38–126)
Anion gap: 11 (ref 5–15)
BUN: 21 mg/dL (ref 8–23)
CO2: 27 mmol/L (ref 22–32)
Calcium: 9.3 mg/dL (ref 8.9–10.3)
Chloride: 103 mmol/L (ref 98–111)
Creatinine, Ser: 0.92 mg/dL (ref 0.61–1.24)
GFR, Estimated: >60 mL/min (ref >60)
Glucose, Bld: 106 mg/dL — ABNORMAL HIGH (ref 70–99)
Potassium: 3.5 mmol/L (ref 3.5–5.1)
Sodium: 141 mmol/L (ref 135–145)
Total Bilirubin: 0.6 mg/dL (ref 0.3–1.2)
Total Protein: 6.5 g/dL (ref 6.5–8.1)

## 2022-02-03 LAB — RESP PANEL BY RT-PCR (FLU A&B, COVID) ARPGX2
Influenza A by PCR: NEGATIVE
Influenza B by PCR: NEGATIVE
SARS Coronavirus 2 by RT PCR: NEGATIVE

## 2022-02-03 LAB — ETHANOL: Alcohol, Ethyl (B): <10 mg/dL (ref <10)
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
## 2022-02-03 NOTE — ED Notes (Signed)
Fed pt a cup of pineapple and half a cup of pudding. TM ?

## 2022-02-03 NOTE — Discharge Instructions (Addendum)
The tests today did not show any signs of infection or acute medical abnormalities.  Continue to follow up with the psychiatrist for further evaluation ?

## 2022-02-03 NOTE — ED Notes (Signed)
Spoke with pt's daughter. Daughter upset as to why pt is being seen for a psych eval when he has advanced dementia. Daughter states that the pt has been up here for the same and has been cleared and d/c'd back to the facility. Daughter speaking with EDP at this time.  ?
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
## 2022-03-06 ENCOUNTER — Emergency Department (HOSPITAL_COMMUNITY): Payer: Medicare Other

## 2022-03-06 ENCOUNTER — Other Ambulatory Visit: Payer: Self-pay

## 2022-03-06 ENCOUNTER — Emergency Department (HOSPITAL_COMMUNITY)
Admission: EM | Admit: 2022-03-06 | Discharge: 2022-03-06 | Disposition: A | Discharge status: Skilled Nursing Facility | Payer: Medicare Other | Attending: Emergency Medicine | Admitting: Emergency Medicine

## 2022-03-06 DIAGNOSIS — W06XXXA Fall from bed, initial encounter: Secondary | ICD-10-CM | POA: Diagnosis not present

## 2022-03-06 DIAGNOSIS — Z794 Long term (current) use of insulin: Secondary | ICD-10-CM | POA: Diagnosis not present

## 2022-03-06 DIAGNOSIS — E1165 Type 2 diabetes mellitus with hyperglycemia: Secondary | ICD-10-CM | POA: Diagnosis not present

## 2022-03-06 DIAGNOSIS — S0101XA Laceration without foreign body of scalp, initial encounter: Secondary | ICD-10-CM | POA: Diagnosis not present

## 2022-03-06 DIAGNOSIS — Z7984 Long term (current) use of oral hypoglycemic drugs: Secondary | ICD-10-CM | POA: Insufficient documentation

## 2022-03-06 DIAGNOSIS — W19XXXA Unspecified fall, initial encounter: Secondary | ICD-10-CM

## 2022-03-06 DIAGNOSIS — S0990XA Unspecified injury of head, initial encounter: Secondary | ICD-10-CM | POA: Diagnosis present

## 2022-03-06 DIAGNOSIS — R739 Hyperglycemia, unspecified: Secondary | ICD-10-CM

## 2022-03-06 LAB — CBG MONITORING, ED: Glucose-Capillary: 379 mg/dL — ABNORMAL HIGH (ref 70–99)

## 2022-03-06 NOTE — ED Notes (Signed)
Pt taken to ct 

## 2022-03-06 NOTE — ED Notes (Signed)
Pt returned from ct. Nad. No changes. Daughter at bedside ?

## 2022-03-06 NOTE — ED Notes (Addendum)
Bleeding controlled to laceration. Nad.  ?

## 2022-03-06 NOTE — ED Notes (Signed)
EMS arrived to pick pt up. Daughter, Karoline Caldwell provided update about dc summary and pt status ?

## 2022-03-06 NOTE — Discharge Instructions (Addendum)
Please continue checking blood sugar twice a day and ask PCP to adjust medications as needed.  ?

## 2022-03-06 NOTE — ED Notes (Signed)
Spoke with daughter, Janace Hoard gave update on patient's condition. ?
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
## 2022-03-06 NOTE — ED Notes (Signed)
C-com notified of patient needing transportation back to [facility]. ?

## 2022-03-06 NOTE — ED Notes (Signed)
Pt received bolus in route. Talked with Derek Hunter at [facility]. Pt chews on tongue and per Derek Hunter is his normal. Gen weakness noted all over. Will not hold a limb up. Per Derek Hunter pt will follow simple commands only and talks very little.  ?
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
## 2022-03-06 NOTE — ED Provider Notes (Signed)
? ?[facility]  ?Provider Note ? ?CSN: [id number] ?Arrival date & time: 03/06/22 0048 ? ?History ?Chief Complaint  ?Patient presents with  ? Fall  ? ? ?Derek Hunter is a 66 y.o. male [facility] after he rolled out of bed and hit his head. Witnessed by staff at facility, No other injuries noted. Not taking any blood thinners. Staff at SNF confirms this history. He has history of DM, but staff reports his doctor discontinued regular sugar checks because he had been well controlled.  ? ? ?Home Medications ?Prior to Admission medications   ?Medication Sig Start Date End Date Taking? Authorizing Provider  ?acetaminophen (TYLENOL) 325 MG tablet Take 650 mg by mouth every 6 (six) hours as needed.    [provider]  ?baclofen (LIORESAL) 10 MG tablet Take 5 mg by mouth 3 (three) times daily.    [provider]  ?benztropine (COGENTIN) 1 MG tablet Take 1 mg by mouth at bedtime. 11/18/21   [provider]  ?cloNIDine (CATAPRES) 0.1 MG tablet Take 0.1 mg by mouth daily. 09/19/21   [provider]  ?donepezil (ARICEPT) 5 MG tablet Take 5 mg by mouth 2 (two) times daily. 11/18/21   [provider]  ?escitalopram (LEXAPRO) 5 MG tablet Take 5 mg by mouth daily. 11/18/21   [provider]  ?GLIPIZIDE XL 5 MG 24 hr tablet Take 5 mg by mouth daily. 02/09/17   [provider]  ?INGREZZA 40 MG capsule Take 60 mg by mouth at bedtime. 11/29/21   [provider]  ?Insulin Glargine (BASAGLAR KWIKPEN) 100 UNIT/ML SOPN Inject 23 Units into the skin daily. Inject 23 units into the skin every morning and 36 units at bedtime    [provider]  ?LORazepam (ATIVAN) 0.5 MG tablet Take 0.5 mg by mouth See admin instructions. Take 0.25 mg in the morning and 0.5 mg every evening 09/21/20   [provider]  ?LORazepam (ATIVAN) 0.5 MG tablet Take 0.5 mg by mouth See admin instructions. Take 0.5 mg every evening and 0.25  every morning    [provider]  ?losartan (COZAAR) 25 MG tablet Take 25 mg by mouth daily. 08/22/20   [provider]  ?LYRICA 50 MG capsule Take 150 mg by mouth at bedtime. 05/10/18   [provider]  ?melatonin 3 MG TABS tablet Take 6 mg by mouth at bedtime.    [provider]  ?metFORMIN (GLUCOPHAGE) 1000 MG tablet Take 1,000 mg by mouth 2 (two) times daily with a meal.    [provider]  ?metoprolol tartrate (LOPRESSOR) 25 MG tablet Take 12.5 mg by mouth 2 (two) times daily.    [provider]  ?OLANZapine (ZYPREXA) 7.5 MG tablet Take 7.5 mg by mouth 2 (two) times daily. 11/23/21   [provider]  ?Omega-3 Fatty Acids (FISH OIL) 1000 MG CPDR Take 1 capsule by mouth every evening.    [provider]  ?omeprazole (PRILOSEC) 40 MG capsule Take 20 mg by mouth daily. 02/07/17   [provider]  ?pregabalin (LYRICA) 150 MG capsule Take 150 mg by mouth daily. 12/12/21   [provider]  ?psyllium (REGULOID) 0.52 g capsule Take 2 g by mouth daily.    [provider]  ?senna (SENOKOT) 8.6 MG TABS tablet Take 1 tablet by mouth daily as needed for mild constipation.    [provider]  ?simvastatin (ZOCOR) 20 MG tablet Take 20 mg  by mouth every evening.     [provider]  ?sitaGLIPtin (JANUVIA) 100 MG tablet Take 100 mg by mouth daily. 08/22/20   [provider]  ?traZODone (DESYREL) 50 MG tablet Take 100 mg by mouth at bedtime. 11/18/21   [provider]  ? ? ? ?Allergies    ?Penicillins and Hydrocodone ? ? ?Review of Systems   ?Review of Systems ?Please see HPI for pertinent positives and negatives ? ?Physical Exam ?BP 134/77 (BP Location: Right Arm)   Pulse 73   Temp 98.3 ?F (36.8 ?C) (Oral)   Resp 19   SpO2 98%  ? ?Physical Exam ?Vitals and nursing note reviewed.  ?Constitutional:   ?   Appearance: Normal appearance.  ?HENT:  ?   Head: Normocephalic.  ?   Comments: 2.5cm  superficial laceration to scalp, not bleeding ?   Nose: Nose normal.  ?   Mouth/Throat:  ?   Mouth: Mucous membranes are moist.  ?Eyes:  ?   Extraocular Movements: Extraocular movements intact.  ?   Conjunctiva/sclera: Conjunctivae normal.  ?Cardiovascular:  ?   Rate and Rhythm: Normal rate.  ?Pulmonary:  ?   Effort: Pulmonary effort is normal.  ?   Breath sounds: Normal breath sounds.  ?Abdominal:  ?   General: Abdomen is flat.  ?   Palpations: Abdomen is soft.  ?   Tenderness: There is no abdominal tenderness.  ?Musculoskeletal:     ?   General: No swelling. Normal range of motion.  ?   Cervical back: Neck supple.  ?Skin: ?   General: Skin is warm and dry.  ?Neurological:  ?   General: No focal deficit present.  ?   Mental Status: Mental status is at baseline.  ?   Comments: Sleeping soundly which is reportedly baseline for him at this time of night.   ?Psychiatric:     ?   Mood and Affect: Mood normal.  ? ? ?ED Results / Procedures / Treatments   ?EKG ?None ? ?Procedures ?Marland Kitchen.Laceration Repair ? ?Date/Time: 03/06/2022 6:19 AM ?Performed by: Pollyann Savoy, MD ?Authorized by: Pollyann Savoy, MD  ? ?Consent:  ?  Consent obtained:  Emergent situation ?Laceration details:  ?  Location:  Scalp ?  Scalp location:  Frontal ?  Length (cm):  2.5 ?Skin repair:  ?  Repair method:  Tissue adhesive ?Approximation:  ?  Approximation:  Close ?Repair type:  ?  Repair type:  Simple ?Post-procedure details:  ?  Dressing:  Open (no dressing) ? ?Medications Ordered in the ED ?Medications - No data to display ? ?Initial Impression and Plan ? Patient with mechanical fall from SNF. Noted to have scalp laceration but not bleeding or open at this time. Will dermabond for closure. Glucose elevated with EMS, improved after IVF enroute.  ? ?ED Course  ? ?Clinical Course as of 03/06/22 0619  ?Mon Mar 06, 2022  ?[id number] I personally viewed the images from radiology studies and agree with radiologist interpretation: CT neg for acute process.   ? [CS]  ?[id number] Attempted to contact daughter who was in the department earlier but left before I was able to see the patient or speak to her. Call went to voicemail. Will return patient to [facility]. I recommend they resuming checking his glucose regularly so PCP can adjust medications as needed.  [CS]  ?  ?Clinical Course User Index ?[CS] Pollyann Savoy, MD  ? ? ? ?MDM Rules/Calculators/A&P ?Medical Decision Making ?Problems Addressed: ?Fall,  initial encounter: acute illness or injury ?Hyperglycemia: acute illness or injury ?Injury of head, initial encounter: acute illness or injury ? ?Amount and/or Complexity of Data Reviewed ?Labs: ordered. Decision-making details documented in ED Course. ?Radiology: ordered and independent interpretation performed. Decision-making details documented in ED Course. ? ? ? ?Final Clinical Impression(s) / ED Diagnoses ?Final diagnoses:  ?Fall, initial encounter  ?Injury of head, initial encounter  ?Hyperglycemia  ? ? ?Rx / DC Orders ?ED Discharge Orders   ? ? None  ? ?  ? ?  ?Pollyann Savoy, MD ?03/06/22 [phone] ? ?

## 2022-03-27 DEATH — deceased
